# Patient Record
Sex: Female | Born: 1985 | Race: Black or African American | Hispanic: No | Marital: Single
Health system: Midwestern US, Community
[De-identification: ages and names within clinical notes are randomized; demographics above are authoritative.]

## PROBLEM LIST (undated history)

## (undated) DIAGNOSIS — R519 Headache, unspecified: Secondary | ICD-10-CM

## (undated) DIAGNOSIS — Z803 Family history of malignant neoplasm of breast: Secondary | ICD-10-CM

## (undated) DIAGNOSIS — F329 Major depressive disorder, single episode, unspecified: Secondary | ICD-10-CM

## (undated) DIAGNOSIS — F17200 Nicotine dependence, unspecified, uncomplicated: Secondary | ICD-10-CM

## (undated) DIAGNOSIS — E669 Obesity, unspecified: Secondary | ICD-10-CM

## (undated) DIAGNOSIS — Z9189 Other specified personal risk factors, not elsewhere classified: Secondary | ICD-10-CM

## (undated) DIAGNOSIS — R51 Headache: Secondary | ICD-10-CM

## (undated) DIAGNOSIS — Z1371 Encounter for nonprocreative screening for genetic disease carrier status: Secondary | ICD-10-CM

## (undated) DIAGNOSIS — F32A Depression, unspecified: Secondary | ICD-10-CM

## (undated) HISTORY — DX: Encounter for nonprocreative screening for genetic disease carrier status: Z13.71

## (undated) HISTORY — DX: Headache: R51

## (undated) HISTORY — DX: Major depressive disorder, single episode, unspecified: F32.9

## (undated) HISTORY — DX: Headache, unspecified: R51.9

## (undated) HISTORY — DX: Obesity, unspecified: E66.9

## (undated) HISTORY — DX: Nicotine dependence, unspecified, uncomplicated: F17.200

## (undated) HISTORY — DX: Family history of malignant neoplasm of breast: Z80.3

## (undated) HISTORY — DX: Other specified personal risk factors, not elsewhere classified: Z91.89

## (undated) HISTORY — PX: CHALAZION EXCISION: SHX213

## (undated) HISTORY — DX: Depression, unspecified: F32.A

---

## 2013-12-13 ENCOUNTER — Emergency Department (INDEPENDENT_AMBULATORY_CARE_PROVIDER_SITE_OTHER)
Admission: EM | Admit: 2013-12-13 | Discharge: 2013-12-13 | Disposition: A | Discharge status: Home / Self Care | Payer: Self-pay | Source: Home / Self Care | Attending: Family Medicine | Admitting: Family Medicine

## 2013-12-13 ENCOUNTER — Encounter (HOSPITAL_COMMUNITY): Payer: Self-pay | Admitting: Emergency Medicine

## 2013-12-13 DIAGNOSIS — S29019A Strain of muscle and tendon of unspecified wall of thorax, initial encounter: Secondary | ICD-10-CM

## 2013-12-13 DIAGNOSIS — S139XXA Sprain of joints and ligaments of unspecified parts of neck, initial encounter: Secondary | ICD-10-CM

## 2013-12-13 DIAGNOSIS — S239XXA Sprain of unspecified parts of thorax, initial encounter: Secondary | ICD-10-CM

## 2013-12-13 DIAGNOSIS — S161XXA Strain of muscle, fascia and tendon at neck level, initial encounter: Secondary | ICD-10-CM
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
## 2013-12-13 NOTE — Discharge Instructions (Signed)
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 your symptoms are getting worse. MAKE SURE YOU:   Understand these instructions.  Will watch your condition.  Will get help right away if you are not doing well or get worse. Document Released: 07/12/2005 Document Revised: 10/04/2011 Document Reviewed: 12/09/2010 [facility] Patient Information 2014 [place], [place].
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
## 2013-12-17 NOTE — ED Provider Notes (Signed)
Medical screening examination/treatment/procedure(s) were performed by a resident physician or non-physician practitioner and as the supervising physician I was immediately available for consultation/collaboration.  Evan Corey, MD    Evan S Corey, MD 12/17/13 0925 

## 2014-07-10 ENCOUNTER — Emergency Department (HOSPITAL_COMMUNITY)
Admission: EM | Admit: 2014-07-10 | Discharge: 2014-07-10 | Disposition: A | Discharge status: Home / Self Care | Payer: Self-pay | Attending: Emergency Medicine | Admitting: Emergency Medicine

## 2014-07-10 ENCOUNTER — Encounter (HOSPITAL_COMMUNITY): Payer: Self-pay | Admitting: Emergency Medicine

## 2014-07-10 DIAGNOSIS — H109 Unspecified conjunctivitis: Secondary | ICD-10-CM | POA: Insufficient documentation

## 2014-07-10 MED ORDER — CEPHALEXIN 500 MG PO CAPS: 500.0000 mg | ORAL_CAPSULE | Freq: Four times a day (QID) | ORAL | Status: DC

## 2014-07-10 MED ORDER — TOBRAMYCIN 0.3 % OP SOLN: 2.0000 [drp] | OPHTHALMIC | Status: DC

## 2014-07-10 MED ORDER — HYDROCODONE-ACETAMINOPHEN 5-325 MG PO TABS
2.0000 | ORAL_TABLET | Freq: Once | ORAL | Status: AC
  Administered 2014-07-10: 2 via ORAL
  Filled 2014-07-10: qty 2

## 2014-07-10 MED ORDER — HYDROCODONE-ACETAMINOPHEN 5-325 MG PO TABS: 2.0000 | ORAL_TABLET | ORAL | Status: DC | PRN

## 2014-07-10 NOTE — ED Notes (Signed)
Pt a/o x 4 on d/c with family. 

## 2014-07-10 NOTE — ED Notes (Signed)
Pt. reports left eye pain with redness/swelling onset yesterday , denies injury / no blurred vision .

## 2014-07-10 NOTE — ED Provider Notes (Signed)
CSN: [id number]     Arrival date & time 07/10/14  0011 History   First MD Initiated Contact with Patient 07/10/14 0025     Chief Complaint  Patient presents with  . Eye Pain     (Consider location/radiation/quality/duration/timing/severity/associated sxs/prior Treatment) Patient is a 28 y.o. female presenting with eye pain. The history is provided by the patient. No language interpreter was used.  Eye Pain This is a new problem. The current episode started today. The problem occurs constantly. The problem has been gradually worsening. Pertinent negatives include no congestion. Nothing aggravates the symptoms. She has tried nothing for the symptoms. The treatment provided moderate relief.    History reviewed. No pertinent past medical history. History reviewed. No pertinent past surgical history. No family history on file. History  Substance Use Topics  . Smoking status: Never Smoker   . Smokeless tobacco: Not on file  . Alcohol Use: Yes   OB History    No data available     Review of Systems  HENT: Negative for congestion.   Eyes: Positive for pain and redness.  All other systems reviewed and are negative.     Allergies  Review of patient's allergies indicates no known allergies.  Home Medications   Prior to Admission medications   Medication Sig Start Date End Date Taking? Authorizing Provider  cephALEXin (KEFLEX) 500 MG capsule Take 1 capsule (500 mg total) by mouth 4 (four) times daily. 07/10/14   Elson AreasLeslie K Jo Cerone, PA-C  HYDROcodone-acetaminophen (NORCO/VICODIN) 5-325 MG per tablet Take 2 tablets by mouth every 4 (four) hours as needed for moderate pain or severe pain. 07/10/14   Elson AreasLeslie K Ayomide Purdy, PA-C  tobramycin (TOBREX) 0.3 % ophthalmic solution Place 2 drops into the left eye every 4 (four) hours. 07/10/14   Elson AreasLeslie K Klohe Lovering, PA-C   BP 124/74 mmHg  Pulse 93  Temp(Src) 98.2 F (36.8 C) (Oral)  Resp 14  SpO2 100%  LMP 07/08/2014 Physical Exam  Constitutional:  She is oriented to person, place, and time. She appears well-developed and well-nourished.  HENT:  Head: Normocephalic.  Eyes: Conjunctivae are normal. Pupils are equal, round, and reactive to light.  Left eyelids swollen  Conjunctiva injected  No abscess  Neck: Normal range of motion.  Cardiovascular: Normal rate.   Pulmonary/Chest: Effort normal.  Neurological: She is alert and oriented to person, place, and time. She has normal reflexes.  Skin: Skin is warm.  Psychiatric: She has a normal mood and affect.  Nursing note and vitals reviewed.   ED Course  Procedures (including critical care time) Labs Review Labs Reviewed - No data to display  Imaging Review No results found.   EKG Interpretation None      MDM   Final diagnoses:  Conjunctivitis of left eye    tobrex Keflex hydrocodone    Elson AreasLeslie K Kloey Cazarez, PA-C 07/10/14 [id number]  Suzi RootsKevin E Steinl, MD 07/10/14 0120

## 2014-07-10 NOTE — Discharge Instructions (Signed)

## 2014-11-19 ENCOUNTER — Emergency Department (INDEPENDENT_AMBULATORY_CARE_PROVIDER_SITE_OTHER)
Admission: EM | Admit: 2014-11-19 | Discharge: 2014-11-19 | Disposition: A | Discharge status: Home / Self Care | Payer: Self-pay | Source: Home / Self Care

## 2014-11-19 ENCOUNTER — Encounter (HOSPITAL_COMMUNITY): Payer: Self-pay | Admitting: Emergency Medicine

## 2014-11-19 DIAGNOSIS — S0502XA Injury of conjunctiva and corneal abrasion without foreign body, left eye, initial encounter: Secondary | ICD-10-CM

## 2014-11-19 MED ORDER — OLOPATADINE HCL 0.2 % OP SOLN: 1.0000 [drp] | Freq: Every day | OPHTHALMIC | Status: DC

## 2014-11-19 MED ORDER — POLYMYXIN B-TRIMETHOPRIM 10000-0.1 UNIT/ML-% OP SOLN: 2.0000 [drp] | Freq: Four times a day (QID) | OPHTHALMIC | Status: DC

## 2014-11-19 MED ORDER — TETRACAINE HCL 0.5 % OP SOLN
OPHTHALMIC | Status: AC
  Filled 2014-11-19: qty 2

## 2014-11-19 NOTE — ED Notes (Signed)
Reports having problems with contacts.  Pt states that she is unsure if contact is displaced in eye.  Having a lot of pain and redness in the left eye, mild pain in right.  Incident happened today.

## 2014-11-19 NOTE — ED Provider Notes (Signed)
CSN: [id number]     Arrival date & time 11/19/14  1751 History   None    Chief Complaint  Patient presents with  . Eye Problem   (Consider location/radiation/quality/duration/timing/severity/associated sxs/prior Treatment) HPI  Eye pain bilat. Started this morning. Slept with contacts in last night which is typical for pt. Discharge present. Difficulty seeing  out of her right eye.. Contacts in place for 4 wks or more. Sent never removes her contacts with their place. Denies fevers, chest pain, shortness of breath, palpitations, headache, nausea, vomiting, neck pain, rash.  History reviewed. No pertinent past medical history. History reviewed. No pertinent past surgical history. Family History  Problem Relation Age of Onset  . Cancer Mother     breast   History  Substance Use Topics  . Smoking status: Never Smoker   . Smokeless tobacco: Not on file  . Alcohol Use: Yes   OB History    No data available     Review of Systems Per HPI with all other pertinent systems negative.   Allergies  Sulfa antibiotics  Home Medications   Prior to Admission medications   Medication Sig Start Date End Date Taking? Authorizing Provider  Olopatadine HCl 0.2 % SOLN Apply 1 drop to eye daily. 11/19/14   Ozella Rocksavid J Merrell, MD  trimethoprim-polymyxin b (POLYTRIM) ophthalmic solution Place 2 drops into the left eye every 6 (six) hours. Treat for 5-7 days 11/19/14   Ozella Rocksavid J Merrell, MD   BP 119/90 mmHg  Pulse 90  Temp(Src) 98.1 F (36.7 C) (Oral)  Resp 12  SpO2 100%  LMP 11/01/2014 Physical Exam Physical Exam  Constitutional: oriented to person, place, and time. appears well-developed and well-nourished. No distress.  HENT:  Head: Normocephalic and atraumatic.  Eyes: EOMI, PERRLA, left cornea under foreseen exam with small pinpoint defect in the center of the eye which would've been under the center of the contact lens. Of note contact lens for the left eye was removed and noted to have  ragged edges throughout. Right contact lens was not in place and patient was unable to locate it. No corneal abrasion of the right.  Neck: Normal range of motion.  Cardiovascular: RRR, no m/r/g, 2+ distal pulses,  Pulmonary/Chest: Effort normal and breath sounds normal. No respiratory distress.  Abdominal: Soft. Bowel sounds are normal. NonTTP, no distension.  Musculoskeletal: Normal range of motion. Non ttp, no effusion.  Neurological: alert and oriented to person, place, and time.  Skin: Skin is warm. No rash noted. non diaphoretic.  Psychiatric: normal mood and affect. behavior is normal. Judgment and thought content normal.    ED Course  Procedures (including critical care time) Labs Review Labs Reviewed - No data to display  Imaging Review No results found.   MDM   1. Corneal abrasion, left, initial encounter    Patient counseled on proper contact lens use. Patient needs to forego wearing contact lenses until current infection improves. Polytrim drops and Pataday drops for relief.    Ozella Rocksavid J Merrell, MD 11/19/14 [phone]

## 2014-11-19 NOTE — Discharge Instructions (Signed)
You have caused a corneal abrasion of her left eye. This is been complicated by a bacterial infection of the eye. Please start the antibiotic drops. Please also use the Pataday drops for red eye relief. If the Pataday is expensive you can buy over-the-counter Zaditor. This was caused in part due to wearing contact lenses for such a prolonged period of time. Please remove these every night if they are reusable put them back in in the morning. Please change them as prescribed or much more frequently than currently doing.

## 2015-09-25 ENCOUNTER — Encounter: Payer: Self-pay | Admitting: Family Medicine

## 2015-09-25 ENCOUNTER — Ambulatory Visit (INDEPENDENT_AMBULATORY_CARE_PROVIDER_SITE_OTHER): Payer: Managed Care, Other (non HMO) | Admitting: Family Medicine

## 2015-09-25 VITALS — BP 128/78 | HR 88 | Temp 98.0°F | Ht 67.0 in | Wt 198.2 lb

## 2015-09-25 DIAGNOSIS — Z23 Encounter for immunization: Secondary | ICD-10-CM

## 2015-09-25 DIAGNOSIS — Z87891 Personal history of nicotine dependence: Secondary | ICD-10-CM | POA: Insufficient documentation

## 2015-09-25 DIAGNOSIS — L84 Corns and callosities: Secondary | ICD-10-CM

## 2015-09-25 DIAGNOSIS — F17200 Nicotine dependence, unspecified, uncomplicated: Secondary | ICD-10-CM | POA: Insufficient documentation

## 2015-09-25 DIAGNOSIS — Z01419 Encounter for gynecological examination (general) (routine) without abnormal findings: Secondary | ICD-10-CM

## 2015-09-25 DIAGNOSIS — Z720 Tobacco use: Secondary | ICD-10-CM

## 2015-09-25 DIAGNOSIS — Z0000 Encounter for general adult medical examination without abnormal findings: Secondary | ICD-10-CM

## 2015-09-25 DIAGNOSIS — E669 Obesity, unspecified: Secondary | ICD-10-CM | POA: Diagnosis not present

## 2015-09-25 DIAGNOSIS — E66811 Obesity, class 1: Secondary | ICD-10-CM

## 2015-09-25 DIAGNOSIS — E041 Nontoxic single thyroid nodule: Secondary | ICD-10-CM | POA: Diagnosis not present

## 2015-09-25 HISTORY — DX: Personal history of nicotine dependence: Z87.891

## 2015-09-25 HISTORY — DX: Obesity, class 1: E66.811

## 2015-09-25 HISTORY — DX: Nicotine dependence, unspecified, uncomplicated: F17.200

## 2015-09-25 HISTORY — DX: Corns and callosities: L84

## 2015-09-25 HISTORY — DX: Obesity, unspecified: E66.9

## 2015-09-25 NOTE — Assessment & Plan Note (Signed)
Discussed healthy diet and lifestyle changes to affect sustainable weight loss  

## 2015-09-25 NOTE — Assessment & Plan Note (Signed)
Longstanding, worse over last year. Will refer to podiatry for further eval/treatment.

## 2015-09-25 NOTE — Progress Notes (Signed)
BP 128/78 mmHg  Pulse 88  Temp(Src) 98 F (36.7 C) (Oral)  Ht  (1.702 m)  Wt 198 lb 4 oz (89.926 kg)  BMI 31.04 kg/m2  LMP 09/01/2015   CC: new pt to establish care  Subjective:    Patient ID: Anna Bass, female    DOB: [birth year], 30 y.o.   MRN: [id number]  HPI: Anna Bass is a 30 y.o. female presenting on 09/25/2015 for Establish Care   [employer] worker.   Irregular bowel movements. Normally formed stool every 2-4 days. H/o fecal impaction 2010 s/p manual disimpaction in [place]. Irregular fiber in diet. Drinking water regularly.   Foot pain - significant calluses bilaterally with pain, worse over the past year. L2 R1  H/o depression but doing well.  Preventative: Last CPE 2012 with well woman exam - always normal pap smears. Would like referral to OBGYN to establish care.  LMP 08/2015 Flu shot today  Tetanus -?2014  Seat belt use discussed No changing moles on skin  Lives with cousin, son and mother Edu: college - nursing --> communications degree Occupation: Publishing copy: [employer] Activity: no regular exercise Diet: fruits/vegetables daily, some water  Relevant past medical, surgical, family and social history reviewed and updated as indicated. Interim medical history since our last visit reviewed. Allergies and medications reviewed and updated. No current outpatient prescriptions on file prior to visit.   No current facility-administered medications on file prior to visit.    Review of Systems Per HPI unless specifically indicated in ROS section     Objective:    BP 128/78 mmHg  Pulse 88  Temp(Src) 98 F (36.7 C) (Oral)  Ht  (1.702 m)  Wt 198 lb 4 oz (89.926 kg)  BMI 31.04 kg/m2  LMP 09/01/2015  Wt Readings from Last 3 Encounters:  09/25/15 198 lb 4 oz (89.926 kg)    Physical Exam  Constitutional: She is oriented to person, place, and time. She appears well-developed and well-nourished. No distress.  HENT:  Head:  Normocephalic and atraumatic.  Right Ear: Hearing, tympanic membrane, external ear and ear canal normal.  Left Ear: Hearing, tympanic membrane, external ear and ear canal normal.  Nose: Nose normal.  Mouth/Throat: Uvula is midline, oropharynx is clear and moist and mucous membranes are normal. No oropharyngeal exudate, posterior oropharyngeal edema or posterior oropharyngeal erythema.  Eyes: Conjunctivae and EOM are normal. Pupils are equal, round, and reactive to light. No scleral icterus.  Neck: Normal range of motion. Neck supple. Thyromegaly (R thyroid nodule) present.  Cardiovascular: Normal rate, regular rhythm, normal heart sounds and intact distal pulses.   No murmur heard. Pulses:      Radial pulses are 2+ on the right side, and 2+ on the left side.  Pulmonary/Chest: Effort normal and breath sounds normal. No respiratory distress. She has no wheezes. She has no rales.  Abdominal: Soft. Bowel sounds are normal. She exhibits no distension and no mass. There is no tenderness. There is no rebound and no guarding.  Musculoskeletal: Normal range of motion. She exhibits no edema.  Thickened calluses L x2, R x1  Lymphadenopathy:    She has no cervical adenopathy.  Neurological: She is alert and oriented to person, place, and time.  CN grossly intact, station and gait intact  Skin: Skin is warm and dry. No rash noted.  Psychiatric: She has a normal mood and affect. Her behavior is normal. Judgment and thought content normal.  Nursing note and vitals reviewed.  No results found for this or any previous visit.    Assessment & Plan:   Problem List Items Addressed This Visit    Smoker    Continue to encourage cessation.       Right thyroid nodule - Primary    Check thyroid US. No fmhx thyroid disease. Return for TFTs      Relevant Orders   US Soft Tissue Head/Neck   TSH   T4, free   Obesity, Class I, BMI 30-34.9    Discussed healthy diet and lifestyle changes to affect  sustainable weight loss      Relevant Orders   Lipid panel   Basic metabolic panel   Hepatic function panel   Corns and callus    Longstanding, worse over last year. Will refer to podiatry for further eval/treatment.       Relevant Orders   Ambulatory referral to Podiatry    Other Visit Diagnoses    Well woman exam        Relevant Orders    Ambulatory referral to Gynecology        Follow up plan: Return in about 1 year (around 09/24/2016) for annual exam, prior fasting for blood work.

## 2015-09-25 NOTE — Patient Instructions (Addendum)
Flu shot today. Increase water in your diet and start metamucil or benefiber (soluble fiber supplement) daily.  Double check on last tetanus shot so we can update your chart (?student health at school). We will refer you to OBGYN for well woman exam. Pass by Marion's office to schedule thyroid ultrasound Return at your convenience for fasting labs (no eating for 4-5 hours prior).  We will refer you to podiatrist for evaluation of feet. Return as needed or in 1 year for physical.

## 2015-09-25 NOTE — Addendum Note (Signed)
Addended by: Josph Macho A on: 09/25/2015 04:21 PM   Modules accepted: Orders

## 2015-09-25 NOTE — Assessment & Plan Note (Signed)
Continue to encourage cessation. 

## 2015-09-25 NOTE — Assessment & Plan Note (Signed)
Check thyroid US. No fmhx thyroid disease. Return for TFTs

## 2015-09-25 NOTE — Progress Notes (Signed)
Pre visit review using our clinic review tool, if applicable. No additional management support is needed unless otherwise documented below in the visit note. 

## 2015-10-02 ENCOUNTER — Ambulatory Visit
Admission: RE | Admit: 2015-10-02 | Discharge: 2015-10-02 | Disposition: A | Discharge status: Home / Self Care | Payer: Managed Care, Other (non HMO) | Source: Ambulatory Visit | Attending: Family Medicine | Admitting: Family Medicine

## 2015-10-02 DIAGNOSIS — E041 Nontoxic single thyroid nodule: Secondary | ICD-10-CM

## 2015-10-03 ENCOUNTER — Encounter: Payer: Self-pay | Admitting: *Deleted

## 2015-10-09 ENCOUNTER — Ambulatory Visit (INDEPENDENT_AMBULATORY_CARE_PROVIDER_SITE_OTHER): Payer: Managed Care, Other (non HMO) | Admitting: Podiatry

## 2015-10-09 ENCOUNTER — Encounter: Payer: Self-pay | Admitting: Podiatry

## 2015-10-09 ENCOUNTER — Ambulatory Visit (INDEPENDENT_AMBULATORY_CARE_PROVIDER_SITE_OTHER): Payer: Managed Care, Other (non HMO)

## 2015-10-09 VITALS — BP 100/72 | HR 83 | Resp 16 | Ht 67.0 in | Wt 198.0 lb

## 2015-10-09 DIAGNOSIS — M79671 Pain in right foot: Secondary | ICD-10-CM

## 2015-10-09 DIAGNOSIS — M79672 Pain in left foot: Secondary | ICD-10-CM | POA: Diagnosis not present

## 2015-10-09 DIAGNOSIS — L84 Corns and callosities: Secondary | ICD-10-CM | POA: Diagnosis not present

## 2015-10-09 DIAGNOSIS — M216X9 Other acquired deformities of unspecified foot: Secondary | ICD-10-CM | POA: Diagnosis not present

## 2015-10-09 NOTE — Progress Notes (Signed)
   Subjective:    Patient ID: Anna Bass, female    DOB: 08/28/1985, 30 y.o.   MRN: [id number]  HPI Patient presents with bilateral callouses; plantar forefoot. Pt stated, "pain is on and off when walking"; x2-3 yrs.   Review of Systems  All other systems reviewed and are negative.      Objective:   Physical Exam        Assessment & Plan:

## 2015-10-15 NOTE — Progress Notes (Signed)
Subjective:     Patient ID: Anna Bass, female   DOB: 12/18/1985, 30 y.o.   MRN: [id number]  HPI patient presents stating I have these very painful corns and calluses on both my feet that make it hard to walk. Patient does have moderate obesity which is complicating factor   Review of Systems  All other systems reviewed and are negative.      Objective:   Physical Exam  Constitutional: She is oriented to person, place, and time.  Cardiovascular: Intact distal pulses.   Musculoskeletal: Normal range of motion.  Neurological: She is oriented to person, place, and time.  Skin: Skin is warm.  Nursing note and vitals reviewed.  neurovascular status intact muscle strength adequate range of motion within normal limits with patient found to have severe plantar calluses on metatarsal surfaces that are painful when pressed and make walking difficult. Patient has tried trimming them herself and has tried padding without relief. Found to have good digital perfusion and well oriented 3     Assessment:     Plantar keratotic lesions with pain bilateral    Plan:     H&P condition reviewed and discussed treatment options today. At this time her to treat conservatively with deep debridement with the consideration long-term for either osteotomy or metatarsal head resection depending on response. I will reevaluate in 4 weeks and decide how quickly they are thickening and what would be best and reviewed x-rays with her today  X-ray report indicates they are on the metatarsal heads and do affect the fifth and first metatarsal with sesamoid involvement

## 2015-10-21 ENCOUNTER — Encounter: Payer: Managed Care, Other (non HMO) | Admitting: Obstetrics & Gynecology

## 2015-11-06 ENCOUNTER — Ambulatory Visit: Payer: Managed Care, Other (non HMO) | Admitting: Podiatry

## 2015-11-20 ENCOUNTER — Telehealth: Payer: Self-pay

## 2015-11-20 NOTE — Telephone Encounter (Signed)
Called patient about rescheduling a visit from a referral that was placed at our office, no answer, left message, instructing her to return my call here at the office.

## 2015-12-01 ENCOUNTER — Telehealth: Payer: Self-pay

## 2015-12-01 NOTE — Telephone Encounter (Signed)
Called patient about referral that was sent to our office, attempting to reschedule appt, no answer, left voicemail instructing patient to return my call here at the office

## 2017-03-04 IMAGING — US US SOFT TISSUE HEAD/NECK
1 series · 14 of 25 positions shown · non-contrast
Comparison: None.

CLINICAL DATA: 29-year-old female with a right-sided thyroid nodule
on physical exam

EXAM:
THYROID ULTRASOUND
TECHNIQUE: Ultrasound examination of the thyroid gland and adjacent soft
tissues was performed.

[Series 1: us soft tissue head/neck · 0.08mm/px · 14 of 37 slices shown]
[im 1/37]
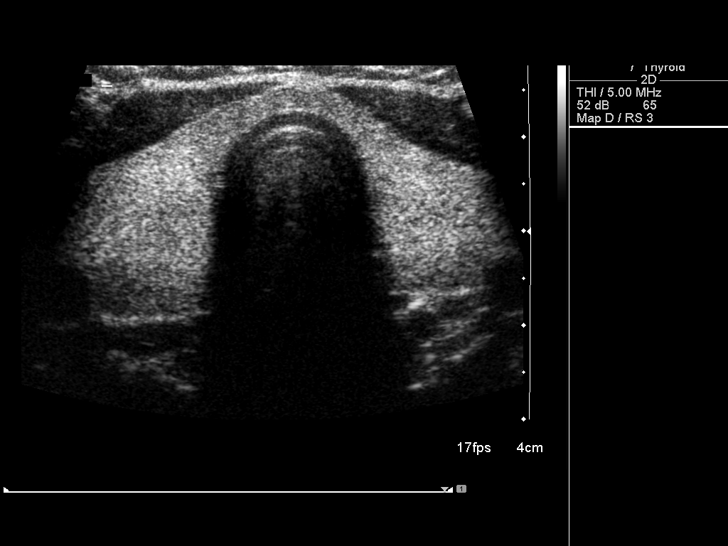
[im 4/37]
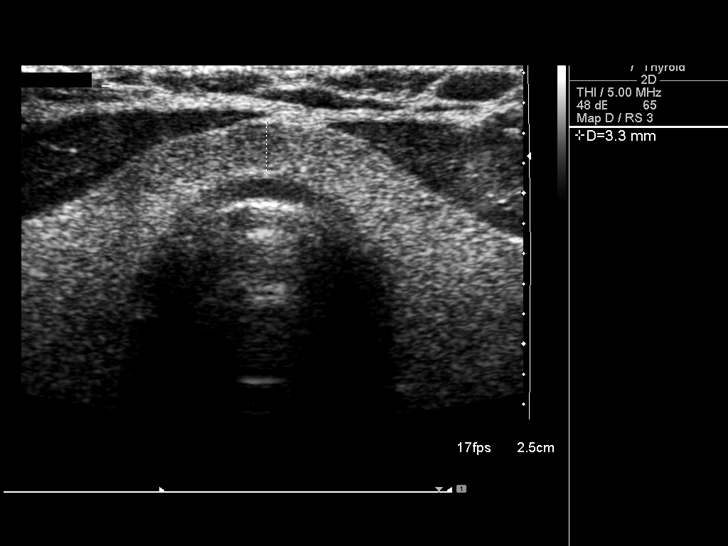
[im 7/37]
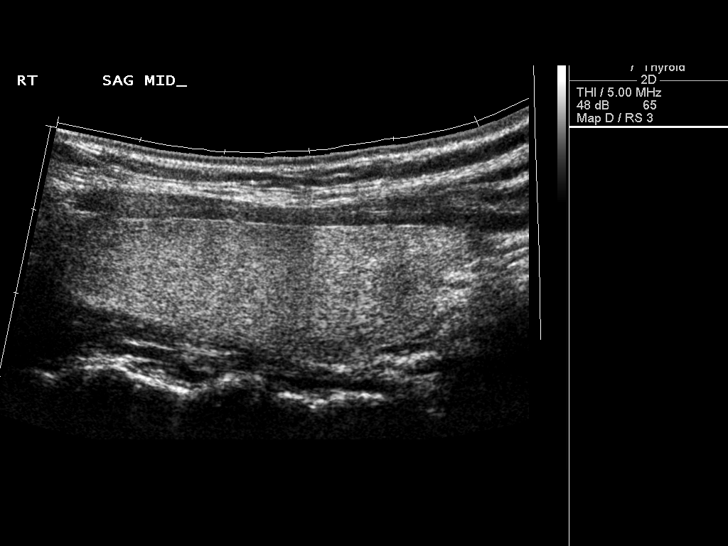
[im 10/37]
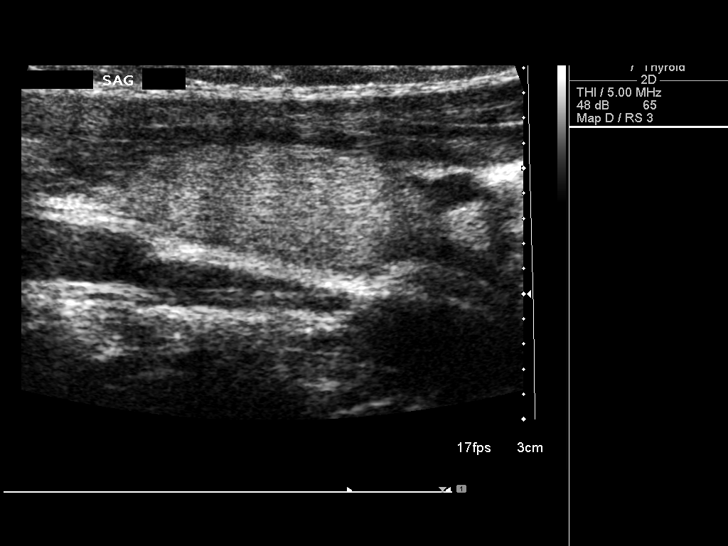
[im 13/37]
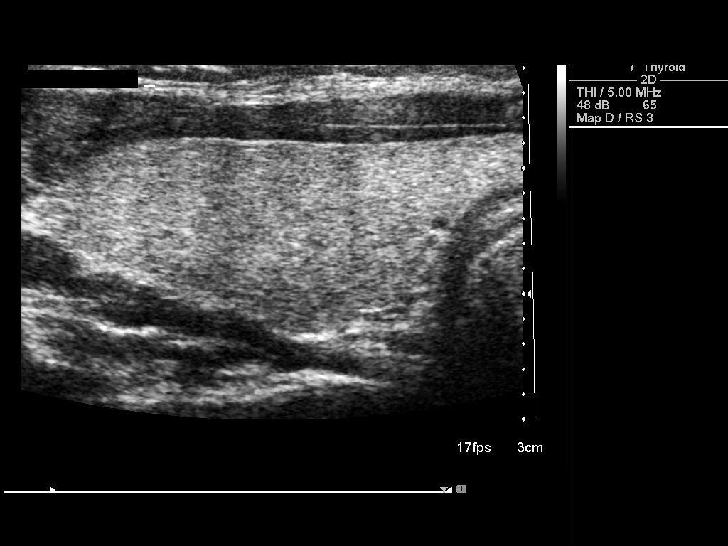
[im 14/37]
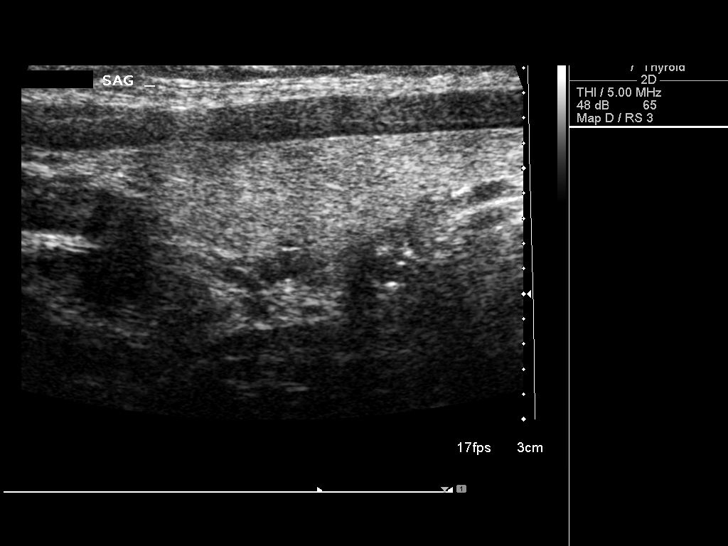
[im 17/37]
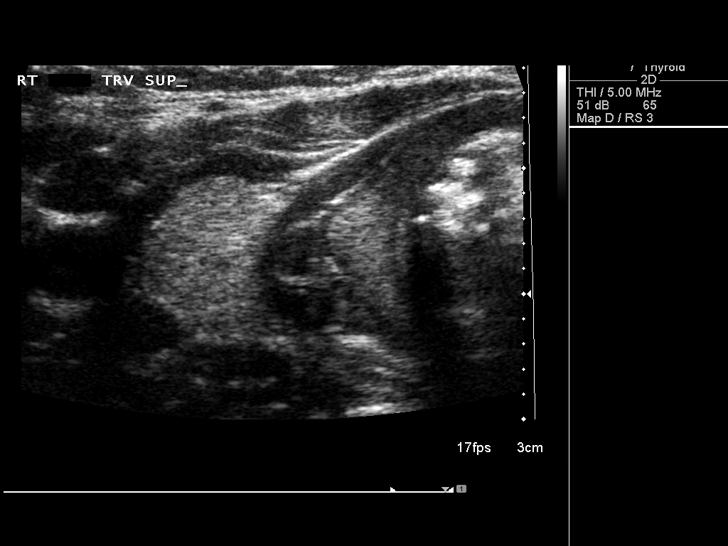
[im 20/37]
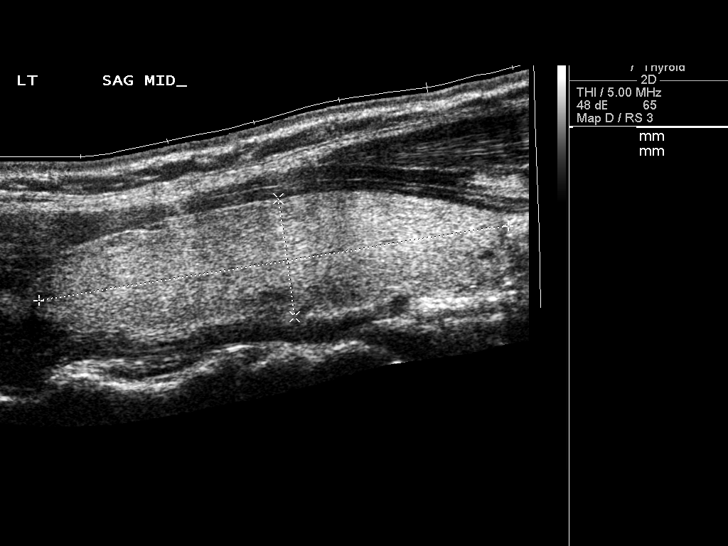
[im 23/37]
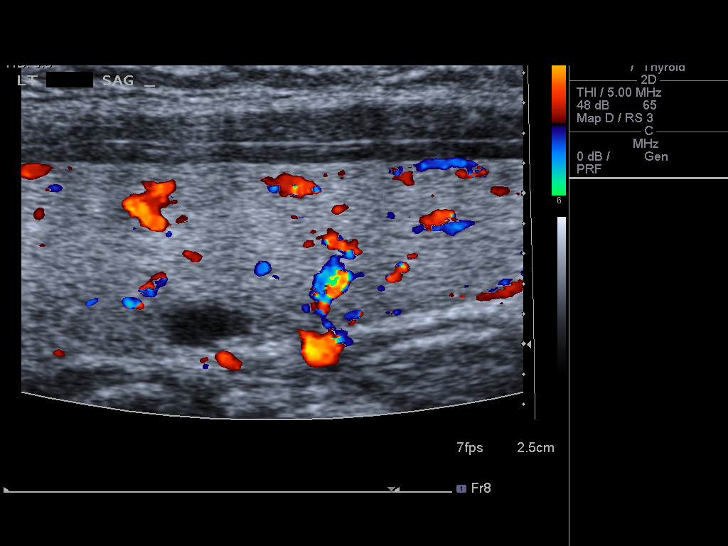
[im 25/37]
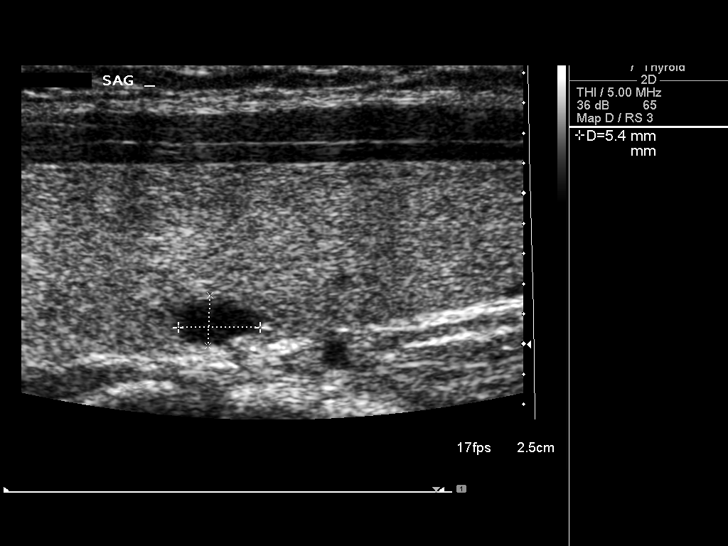
[im 28/37]
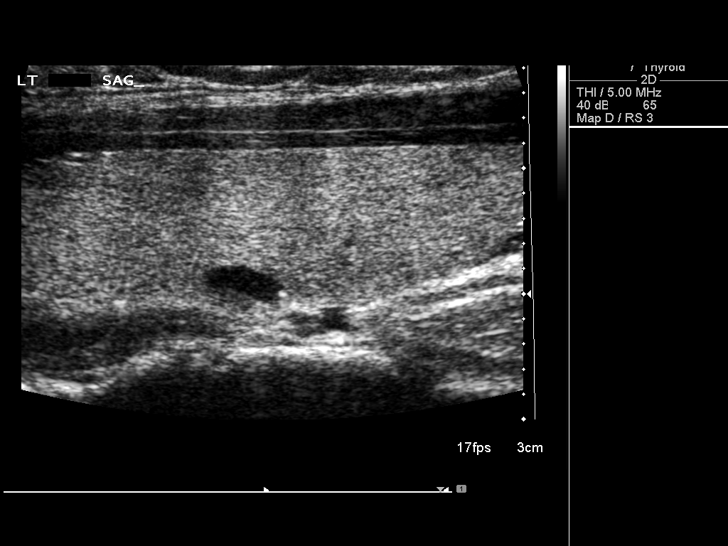
[im 31/37]
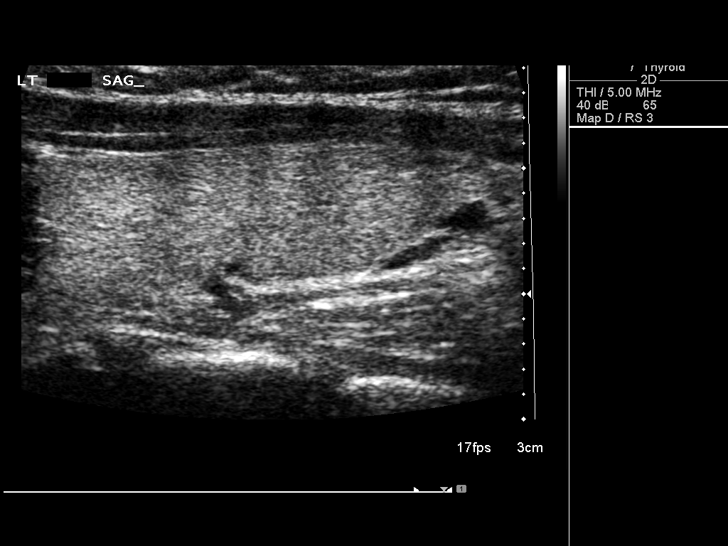
[im 34/37]
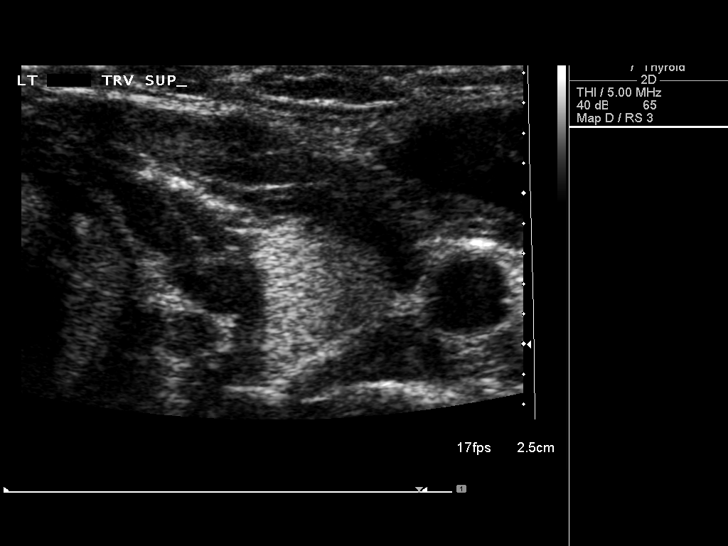
[im 37/37]
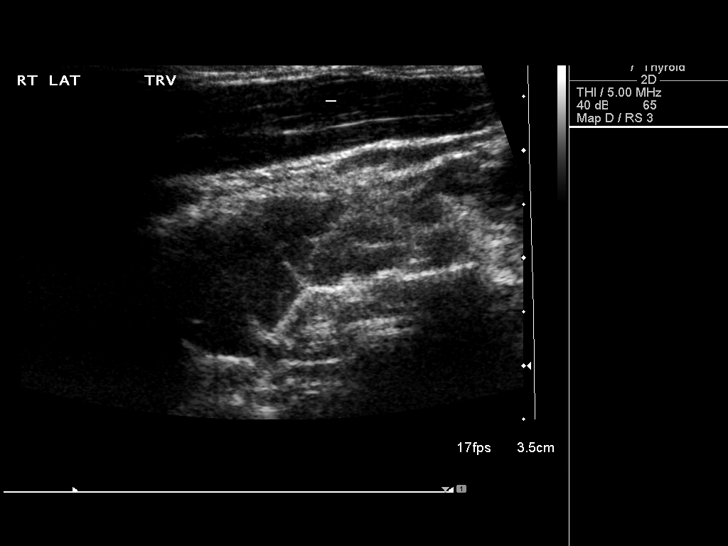

[14 of 25 positions shown; findings below may reference images not displayed]

FINDINGS: Right thyroid lobe

Measurements: 5.1 x 1.6 x 2.3 cm.  No nodules visualized.

Left thyroid lobe

Measurements: 5.4 x 1.4 x 1.8 cm. Tiny hypoechoic cyst versus nodule
in the deep aspect of the mid to lower gland measuring 5 x 3 x 5 mm.

Isthmus

Thickness: 0.3 cm.  No nodules visualized.

Lymphadenopathy

None visualized.
IMPRESSION: 1. No evidence of right-sided thyroid nodule.
2. Incidental note is made of a tiny 5 mm cyst versus nodule in the
mid to lower left gland.

## 2017-06-24 ENCOUNTER — Encounter (HOSPITAL_COMMUNITY): Payer: Self-pay | Admitting: *Deleted

## 2017-06-24 DIAGNOSIS — M545 Low back pain: Secondary | ICD-10-CM | POA: Insufficient documentation

## 2017-06-24 LAB — COMPREHENSIVE METABOLIC PANEL
ALBUMIN: 4.1 g/dL (ref 3.5–5.0)
ALT: 14 U/L (ref 14–54)
AST: 19 U/L (ref 15–41)
Alkaline Phosphatase: 57 U/L (ref 38–126)
Anion gap: 6 (ref 5–15)
BILIRUBIN TOTAL: 0.3 mg/dL (ref 0.3–1.2)
BUN: 10 mg/dL (ref 6–20)
CO2: 26 mmol/L (ref 22–32)
Calcium: 9.3 mg/dL (ref 8.9–10.3)
Chloride: 104 mmol/L (ref 101–111)
Creatinine, Ser: 1.07 mg/dL — ABNORMAL HIGH (ref 0.44–1.00)
GFR calc Af Amer: >60 mL/min (ref >60)
GFR calc non Af Amer: >60 mL/min (ref >60)
GLUCOSE: 93 mg/dL (ref 65–99)
Potassium: 3.8 mmol/L (ref 3.5–5.1)
Sodium: 136 mmol/L (ref 135–145)
Total Protein: 6.6 g/dL (ref 6.5–8.1)

## 2017-06-24 LAB — I-STAT BETA HCG BLOOD, ED (MC, WL, AP ONLY): I-stat hCG, quantitative: <5 m[IU]/mL (ref <5)

## 2017-06-24 LAB — CBC
HCT: 39.4 % (ref 36.0–46.0)
Hemoglobin: 13.2 g/dL (ref 12.0–15.0)
MCH: 32.9 pg (ref 26.0–34.0)
MCHC: 33.5 g/dL (ref 30.0–36.0)
MCV: 98.3 fL (ref 78.0–100.0)
PLATELETS: 281 10*3/uL (ref 150–400)
RBC: 4.01 MIL/uL (ref 3.87–5.11)
RDW: 12.5 % (ref 11.5–15.5)
WBC: 6.7 10*3/uL (ref 4.0–10.5)
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
## 2017-06-25 ENCOUNTER — Emergency Department (HOSPITAL_COMMUNITY)
Admission: EM | Admit: 2017-06-25 | Discharge: 2017-06-25 | Disposition: A | Discharge status: Home / Self Care | Payer: Managed Care, Other (non HMO) | Attending: Physician Assistant | Admitting: Physician Assistant

## 2017-06-25 MED ORDER — IBUPROFEN 800 MG PO TABS: 800.0000 mg | ORAL_TABLET | Freq: Three times a day (TID) | ORAL | 0 refills | Status: DC

## 2017-06-25 MED ORDER — CYCLOBENZAPRINE HCL 10 MG PO TABS: 10.0000 mg | ORAL_TABLET | Freq: Two times a day (BID) | ORAL | 0 refills | Status: DC | PRN
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
## 2018-04-20 ENCOUNTER — Emergency Department: Payer: Managed Care, Other (non HMO)

## 2018-04-20 ENCOUNTER — Other Ambulatory Visit: Payer: Self-pay

## 2018-04-20 ENCOUNTER — Emergency Department
Admission: EM | Admit: 2018-04-20 | Discharge: 2018-04-20 | Disposition: A | Discharge status: Home / Self Care | Payer: Managed Care, Other (non HMO) | Attending: Emergency Medicine | Admitting: Emergency Medicine

## 2018-04-20 DIAGNOSIS — S4992XA Unspecified injury of left shoulder and upper arm, initial encounter: Secondary | ICD-10-CM | POA: Diagnosis present

## 2018-04-20 DIAGNOSIS — G8911 Acute pain due to trauma: Secondary | ICD-10-CM | POA: Insufficient documentation

## 2018-04-20 DIAGNOSIS — Y999 Unspecified external cause status: Secondary | ICD-10-CM | POA: Insufficient documentation

## 2018-04-20 DIAGNOSIS — Y9289 Other specified places as the place of occurrence of the external cause: Secondary | ICD-10-CM | POA: Diagnosis not present

## 2018-04-20 DIAGNOSIS — M25512 Pain in left shoulder: Secondary | ICD-10-CM | POA: Diagnosis not present

## 2018-04-20 DIAGNOSIS — T148XXA Other injury of unspecified body region, initial encounter: Secondary | ICD-10-CM

## 2018-04-20 DIAGNOSIS — M70812 Other soft tissue disorders related to use, overuse and pressure, left shoulder: Secondary | ICD-10-CM | POA: Insufficient documentation

## 2018-04-20 DIAGNOSIS — F17210 Nicotine dependence, cigarettes, uncomplicated: Secondary | ICD-10-CM | POA: Insufficient documentation

## 2018-04-20 DIAGNOSIS — Y9389 Activity, other specified: Secondary | ICD-10-CM | POA: Diagnosis not present
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
## 2018-04-20 NOTE — Discharge Instructions (Signed)
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
## 2018-07-31 ENCOUNTER — Telehealth: Payer: Self-pay

## 2018-07-31 ENCOUNTER — Encounter: Payer: Self-pay | Admitting: Family Medicine

## 2018-07-31 ENCOUNTER — Ambulatory Visit: Payer: 59 | Admitting: Family Medicine

## 2018-07-31 VITALS — BP 118/72 | HR 86 | Temp 98.2°F | Ht 67.0 in | Wt 140.2 lb

## 2018-07-31 DIAGNOSIS — R634 Abnormal weight loss: Secondary | ICD-10-CM

## 2018-07-31 DIAGNOSIS — F419 Anxiety disorder, unspecified: Secondary | ICD-10-CM

## 2018-07-31 DIAGNOSIS — I441 Atrioventricular block, second degree: Secondary | ICD-10-CM | POA: Insufficient documentation

## 2018-07-31 DIAGNOSIS — R55 Syncope and collapse: Secondary | ICD-10-CM

## 2018-07-31 DIAGNOSIS — F17200 Nicotine dependence, unspecified, uncomplicated: Secondary | ICD-10-CM

## 2018-07-31 DIAGNOSIS — Z23 Encounter for immunization: Secondary | ICD-10-CM | POA: Diagnosis not present

## 2018-07-31 DIAGNOSIS — Z01419 Encounter for gynecological examination (general) (routine) without abnormal findings: Secondary | ICD-10-CM

## 2018-07-31 HISTORY — DX: Syncope and collapse: R55

## 2018-07-31 HISTORY — DX: Abnormal weight loss: R63.4

## 2018-07-31 LAB — CBC WITH DIFFERENTIAL/PLATELET
Basophils Absolute: 0.1 10*3/uL (ref 0.0–0.1)
Basophils Relative: 0.9 % (ref 0.0–3.0)
Eosinophils Absolute: 0.1 10*3/uL (ref 0.0–0.7)
Eosinophils Relative: 1.7 % (ref 0.0–5.0)
HCT: 41.5 % (ref 36.0–46.0)
Hemoglobin: 13.9 g/dL (ref 12.0–15.0)
LYMPHS PCT: 48.8 % — AB (ref 12.0–46.0)
Lymphs Abs: 3.2 10*3/uL (ref 0.7–4.0)
MCHC: 33.5 g/dL (ref 30.0–36.0)
MCV: 100.3 fl — AB (ref 78.0–100.0)
MONOS PCT: 8.1 % (ref 3.0–12.0)
Monocytes Absolute: 0.5 10*3/uL (ref 0.1–1.0)
NEUTROS PCT: 40.5 % — AB (ref 43.0–77.0)
Neutro Abs: 2.7 10*3/uL (ref 1.4–7.7)
Platelets: 289 10*3/uL (ref 150.0–400.0)
RBC: 4.13 Mil/uL (ref 3.87–5.11)
RDW: 12.8 % (ref 11.5–15.5)
WBC: 6.6 10*3/uL (ref 4.0–10.5)

## 2018-07-31 LAB — TSH: TSH: 0.77 u[IU]/mL (ref 0.35–4.50)

## 2018-07-31 LAB — COMPREHENSIVE METABOLIC PANEL
ALBUMIN: 4.4 g/dL (ref 3.5–5.2)
ALK PHOS: 54 U/L (ref 39–117)
ALT: 16 U/L (ref 0–35)
AST: 16 U/L (ref 0–37)
BUN: 11 mg/dL (ref 6–23)
CO2: 28 mEq/L (ref 19–32)
Calcium: 9.4 mg/dL (ref 8.4–10.5)
Chloride: 107 mEq/L (ref 96–112)
Creatinine, Ser: 0.91 mg/dL (ref 0.40–1.20)
GFR: 91.23 mL/min (ref >60.00)
Glucose, Bld: 83 mg/dL (ref 70–99)
Potassium: 3.9 mEq/L (ref 3.5–5.1)
Sodium: 139 mEq/L (ref 135–145)
TOTAL PROTEIN: 6.8 g/dL (ref 6.0–8.3)
Total Bilirubin: 0.4 mg/dL (ref 0.2–1.2)

## 2018-07-31 NOTE — Telephone Encounter (Signed)
Pt said on 07/30/18 at 8:30 PM pt was at [employer] and felt dizzy and had hot flash and then fainted. LMP 07/25/18. Now pt has H/A pain level 4. When pt fainted she fell and hit head on floor. Pt did not go to ED to be eval. Now no dizziness or vision changes. Pt request appt/ pt scheduled appt with Dr Reece Agar 07/31/18 at 12 noon. It pt condition changes or worsens prior to appt pt will go to ED.

## 2018-07-31 NOTE — Patient Instructions (Addendum)
EKG today Labs today See our referral coordinator for GYN referral.  Work on nutrition - 3 meals a day, consider getting boost or ensure supplements. Consider getting multivitamin.  Return in 4-6 weeks for follow up visit.

## 2018-07-31 NOTE — Telephone Encounter (Signed)
Noted  

## 2018-07-31 NOTE — Progress Notes (Signed)
BP 118/72 (BP Location: Left Arm, Patient Position: Sitting, Cuff Size: Normal)   Pulse 86   Temp 98.2 F (36.8 C) (Oral)   Ht 5\' 7"  (1.702 m)   Wt 140 lb 4 oz (63.6 kg)   LMP 07/25/2018   BMI 21.97 kg/m   Orthostatic VS for the past 24 hrs (Last 3 readings):  BP- Lying BP- Standing at 0 minutes  07/31/18 1220 - 116/80  07/31/18 1218 122/70 -    CC: syncope Subjective:    Patient ID: Anna Bass, female    DOB: Jan 10, 1986, 33 y.o.   MRN: [id number]  HPI: Anna Bass is a 33 y.o. female presenting on 07/31/2018 for Dizziness (Pt states she had some dizziness and fainted on 07/30/17 while at [employer]. Pt hit the back of head. Denies any dizziness since but c/o of minor HA. )    Fainted last night while at [employer]. Felt like she was blacking out - she was also diaphoretic after passing out. Didn't bite tongue, bowel/bladder incontinence. No witnessed seizure activity. No h/o seizures. Ate good breakfast and dinner that day prior to passing out.   Denies palpitations, chest pain, dyspnea, headaches.   This morning woke up with headache, worried she hit her head during fall.   Almost 60 lb weight loss in last 2 years.  Significant stressors over last 3 yrs. Work stress, family stress (single mother).   Denies fevers/chills, night sweats, dyspnea, cough, abd pain, nausea/vomiting, diarrhea/constipation, blood in stool or urine. Denies new lumps on breasts.  + off and on fatigue.   fmhx cancer - mother with leukemia, grandmother with breast and brain cancer, mother with breast cancer.   Last well woman 2012 - referred to [facility] 2017 but did not go.  LMP 07/25/2018, regular, not heavy bleeding. No abnormal spotting or bleeding.   Current smoker - working on quitting with the [employer] - down to 5 cig/day.  Alcohol - 1-2 beers/week.  Rec drugs - denies currently  Anxiety - sees psychiatrist and counselor - prescribed wellbutrin 150mg  bid and klonopin bid (unsure  dose).      Relevant past medical, surgical, family and social history reviewed and updated as indicated. Interim medical history since our last visit reviewed. Allergies and medications reviewed and updated. Outpatient Medications Prior to Visit  Medication Sig Dispense Refill  . buPROPion (WELLBUTRIN SR) 150 MG 12 hr tablet Take 150 mg by mouth 2 (two) times daily.  5  . cyclobenzaprine (FLEXERIL) 10 MG tablet Take 1 tablet (10 mg total) by mouth 2 (two) times daily as needed for muscle spasms. 10 tablet 0  . ibuprofen (ADVIL,MOTRIN) 800 MG tablet Take 1 tablet (800 mg total) by mouth 3 (three) times daily. 21 tablet 0  . temazepam (RESTORIL) 30 MG capsule Take 30 mg by mouth at bedtime.  5  . clonazePAM (KLONOPIN) 1 MG tablet Take 1 tablet (1 mg total) by mouth 2 (two) times daily.     No facility-administered medications prior to visit.      Per HPI unless specifically indicated in ROS section below Review of Systems Objective:    BP 118/72 (BP Location: Left Arm, Patient Position: Sitting, Cuff Size: Normal)   Pulse 86   Temp 98.2 F (36.8 C) (Oral)   Ht 5\' 7"  (1.702 m)   Wt 140 lb 4 oz (63.6 kg)   LMP 07/25/2018   BMI 21.97 kg/m   Wt Readings from Last 3 Encounters:  07/31/18 140  lb 4 oz (63.6 kg)  04/20/18 150 lb (68 kg)  10/09/15 198 lb (89.8 kg)    Physical Exam Vitals signs and nursing note reviewed.  Constitutional:      General: She is not in acute distress.    Appearance: Normal appearance.  HENT:     Mouth/Throat:     Mouth: Mucous membranes are moist.     Pharynx: Oropharynx is clear.  Neck:     Thyroid: No thyroid mass or thyromegaly.  Cardiovascular:     Rate and Rhythm: Normal rate and regular rhythm.     Pulses: Normal pulses.     Heart sounds: Normal heart sounds. No murmur.  Pulmonary:     Effort: Pulmonary effort is normal. No respiratory distress.     Breath sounds: Normal breath sounds. No wheezing, rhonchi or rales.  Musculoskeletal: Normal  range of motion.     Right lower leg: No edema.     Left lower leg: No edema.  Skin:    General: Skin is warm and dry.     Findings: No rash.  Neurological:     General: No focal deficit present.     Mental Status: She is alert.     Cranial Nerves: Cranial nerves are intact.     Sensory: Sensation is intact.     Motor: Motor function is intact.     Coordination: Coordination is intact. Romberg sign negative. Coordination normal.     Gait: Gait is intact.     Comments: CN 2-12 intact FTN intact EOMI No pronator drift  Psychiatric:        Mood and Affect: Mood normal.        Behavior: Behavior normal.       Results for orders placed or performed in visit on 07/31/18  Comprehensive metabolic panel  Result Value Ref Range   Sodium 139 135 - 145 mEq/L   Potassium 3.9 3.5 - 5.1 mEq/L   Chloride 107 96 - 112 mEq/L   CO2 28 19 - 32 mEq/L   Glucose, Bld 83 70 - 99 mg/dL   BUN 11 6 - 23 mg/dL   Creatinine, Ser 3.26 0.40 - 1.20 mg/dL   Total Bilirubin 0.4 0.2 - 1.2 mg/dL   Alkaline Phosphatase 54 39 - 117 U/L   AST 16 0 - 37 U/L   ALT 16 0 - 35 U/L   Total Protein 6.8 6.0 - 8.3 g/dL   Albumin 4.4 3.5 - 5.2 g/dL   Calcium 9.4 8.4 - 71.2 mg/dL   GFR 45.80 >99.83 mL/min  TSH  Result Value Ref Range   TSH 0.77 0.35 - 4.50 uIU/mL  CBC with Differential/Platelet  Result Value Ref Range   WBC 6.6 4.0 - 10.5 K/uL   RBC 4.13 3.87 - 5.11 Mil/uL   Hemoglobin 13.9 12.0 - 15.0 g/dL   HCT 38.2 50.5 - 39.7 %   MCV 100.3 (H) 78.0 - 100.0 fl   MCHC 33.5 30.0 - 36.0 g/dL   RDW 67.3 41.9 - 37.9 %   Platelets 289.0 150.0 - 400.0 K/uL   Neutrophils Relative % 40.5 (L) 43.0 - 77.0 %   Lymphocytes Relative 48.8 (H) 12.0 - 46.0 %   Monocytes Relative 8.1 3.0 - 12.0 %   Eosinophils Relative 1.7 0.0 - 5.0 %   Basophils Relative 0.9 0.0 - 3.0 %   Neutro Abs 2.7 1.4 - 7.7 K/uL   Lymphs Abs 3.2 0.7 - 4.0 K/uL   Monocytes Absolute  0.5 0.1 - 1.0 K/uL   Eosinophils Absolute 0.1 0.0 - 0.7 K/uL    Basophils Absolute 0.1 0.0 - 0.1 K/uL   EKG - NSR rate 80s, normal axis, 2nd degree AV block (type 2) Assessment & Plan:   Problem List Items Addressed This Visit    Weight loss, unintentional    Nearly 60 lb weight loss over the last 3 years (since last seen here) without localizing symptoms other than intermittent fatigue. Pt attributes to high stress of changing jobs and caring for son as single mother. Known fmhx breast cancer. Will start eval with labs (CBC, CMP, TSH) and refer to GYN for well woman exam. Consider iFOB.       Syncope    EKG showing high grade second degree block - see below. Check labwork.       Relevant Orders   EKG 12-Lead (Completed)   Comprehensive metabolic panel (Completed)   TSH (Completed)   CBC with Differential/Platelet (Completed)   Ambulatory referral to Cardiology   Smoker    Encouraged cessation - she states she is working towards this and has been able to decrease to 5 cig/day.       Second degree AV block, Mobitz type II - Primary    Noted on EKG today - check labs (CBC, CMP, TSH). Unable to add lyme disease testing - consider checking. Pt endorses tick bite last year but without significant symptoms at that time.       Relevant Orders   Ambulatory referral to Cardiology   Anxiety    This is managed by psychiatry/psychology. Reviewed dangers of abrupt discontinuation of benzodiazepine.        Other Visit Diagnoses    Need for influenza vaccination       Relevant Orders   Flu Vaccine QUAD 36+ mos IM (Completed)   Well woman exam       Relevant Orders   Ambulatory referral to Gynecology       No orders of the defined types were placed in this encounter.  Orders Placed This Encounter  Procedures  . Flu Vaccine QUAD 36+ mos IM  . Comprehensive metabolic panel  . TSH  . CBC with Differential/Platelet  . Ambulatory referral to Cardiology    Referral Priority:   Urgent    Referral Type:   Consultation    Referral Reason:    Specialty Services Required    Requested Specialty:   Cardiology    Number of Visits Requested:   1  . Ambulatory referral to Gynecology    Referral Priority:   Routine    Referral Type:   Consultation    Referral Reason:   Specialty Services Required    Requested Specialty:   Gynecology    Number of Visits Requested:   1  . EKG 12-Lead    Follow up plan: Return in about 4 weeks (around 08/28/2018) for follow up visit.  Eustaquio BoydenJavier Rayanne Padmanabhan, MD

## 2018-08-01 ENCOUNTER — Encounter: Payer: Self-pay | Admitting: Cardiology

## 2018-08-01 ENCOUNTER — Ambulatory Visit (INDEPENDENT_AMBULATORY_CARE_PROVIDER_SITE_OTHER): Payer: 59 | Admitting: Cardiology

## 2018-08-01 VITALS — BP 118/62 | HR 85 | Ht 67.0 in | Wt 141.0 lb

## 2018-08-01 DIAGNOSIS — F419 Anxiety disorder, unspecified: Secondary | ICD-10-CM | POA: Insufficient documentation

## 2018-08-01 DIAGNOSIS — R55 Syncope and collapse: Secondary | ICD-10-CM

## 2018-08-01 DIAGNOSIS — F17200 Nicotine dependence, unspecified, uncomplicated: Secondary | ICD-10-CM

## 2018-08-01 DIAGNOSIS — R0789 Other chest pain: Secondary | ICD-10-CM

## 2018-08-01 HISTORY — DX: Anxiety disorder, unspecified: F41.9

## 2018-08-01 HISTORY — DX: Other chest pain: R07.89

## 2018-08-01 NOTE — Assessment & Plan Note (Addendum)
Nearly 60 lb weight loss over the last 3 years (since last seen here) without localizing symptoms other than intermittent fatigue. Pt attributes to high stress of changing jobs and caring for son as single mother. Known fmhx breast cancer. Will start eval with labs (CBC, CMP, TSH) and refer to GYN for well woman exam. Consider iFOB.

## 2018-08-01 NOTE — Progress Notes (Signed)
Cardiology Office Note:    Date:  08/01/2018   ID:  Anna KindsSydney Bass, DOB 07/04/1986, MRN [id number]  PCP:  Eustaquio BoydenGutierrez, Javier, MD  Cardiologist:  Garwin Brothersajan R Guss Farruggia, MD   Referring MD: Eustaquio BoydenGutierrez, Javier, MD    ASSESSMENT:    1. Syncope, unspecified syncope type   2. Smoker    PLAN:    In order of problems listed above:  1. I discussed my findings with the patient at extensive length.  My EKG done today was within normal limits.  Her syncopal episode is of concern and will need to be evaluated. 2. I will do an exercise stress echo to understand his symptoms of chest discomfort..  Her symptoms are atypical and occurred at rest without any exertion and not related to exertion.  No radiation of the symptoms to any part of the body. 3. She will have a 2-week monitoring to see if there is any issues with her cardiac conduction. 4. I told her to be in touch with her primary care for a complete syncope evaluation.  I advised her against driving in view of the above.  I spent 5 minutes with the patient discussing solely about smoking. Smoking cessation was counseled. I suggested to the patient also different medications and pharmacological interventions. Patient is keen to try stopping on its own at this time. He will get back to me if he needs any further assistance in this matter. 5. Patient will be seen in follow-up appointment in 6 months or earlier if the patient has any concerns.  She knows to go to the nearest emergency room for any significant concerns.   Medication Adjustments/Labs and Tests Ordered: Current medicines are reviewed at length with the patient today.  Concerns regarding medicines are outlined above.  No orders of the defined types were placed in this encounter.  No orders of the defined types were placed in this encounter.    History of Present Illness:    Anna KindsSydney Bass is a 33 y.o. female who is being seen today for the evaluation of abnormal EKG at the  request of Eustaquio BoydenGutierrez, Javier, MD.  Patient is a pleasant 33 year old female.  [facility] and she tells me that at around 830 she had a passing out spell there.  She denies any chest pain orthopnea or PND.  She mentions to me that she had had a meal at about 6:00.  She felt dizzy and lightheaded and hot flash sensation and fell on the floor.  Subsequently she woke up and had no problems after that.  She has had a similar episode about 3 years ago.  At the time of my evaluation, the patient is alert awake oriented and in no distress.  Unfortunately she is a active smoker and has smoked for a long time.  Past Medical History:  Diagnosis Date  . Depression   . Frequent headaches   . Obesity, Class I, BMI 30-34.9 09/25/2015  . Smoker 09/25/2015    Past Surgical History:  Procedure Laterality Date  . CHALAZION EXCISION      Current Medications: Current Meds  Medication Sig  . buPROPion (WELLBUTRIN SR) 150 MG 12 hr tablet Take 150 mg by mouth 2 (two) times daily.  . clonazePAM (KLONOPIN) 1 MG tablet Take 1 tablet (1 mg total) by mouth 2 (two) times daily.     Allergies:   Sulfa antibiotics   Social History   Socioeconomic History  . Marital status: Single    Spouse  name: Not on file  . Number of children: Not on file  . Years of education: Not on file  . Highest education level: Not on file  Occupational History  . Not on file  Social Needs  . Financial resource strain: Not on file  . Food insecurity:    Worry: Not on file    Inability: Not on file  . Transportation needs:    Medical: Not on file    Non-medical: Not on file  Tobacco Use  . Smoking status: Current Every Day Smoker    Packs/day: 0.50    Years: 10.00    Pack years: 5.00    Types: Cigarettes    Start date: 07/26/2006  . Smokeless tobacco: Never Used  Substance and Sexual Activity  . Alcohol use: Yes    Alcohol/week: 0.0 standard drinks    Comment: occasional  . Drug use: No    Comment: prior MJ use    . Sexual activity: Not on file  Lifestyle  . Physical activity:    Days per week: Not on file    Minutes per session: Not on file  . Stress: Not on file  Relationships  . Social connections:    Talks on phone: Not on file    Gets together: Not on file    Attends religious service: Not on file    Active member of club or organization: Not on file    Attends meetings of clubs or organizations: Not on file    Relationship status: Not on file  Other Topics Concern  . Not on file  Social History Narrative   Lives with cousin, son and mother   Edu: college - nursing --> communications degree   Occupation: Photographer: [employer]   Activity: no regular exercise   Diet: fruits/vegetables daily, some water     Family History: The patient's family history includes Cancer (age of onset: 86) in her mother; Cancer (age of onset: 96) in her maternal grandmother; Heart disease in her maternal grandfather; Leukemia (age of onset: 36) in her mother; Stroke in her maternal grandfather. There is no history of Diabetes.  ROS:   Please see the history of present illness.    All other systems reviewed and are negative.  EKGs/Labs/Other Studies Reviewed:    The following studies were reviewed today: EKG is not convincing for a second-degree AV block.  Therefore I repeated the EKG today and it was normal.   Recent Labs: 07/31/2018: ALT 16; BUN 11; Creatinine, Ser 0.91; Hemoglobin 13.9; Platelets 289.0; Potassium 3.9; Sodium 139; TSH 0.77  Recent Lipid Panel No results found for: CHOL, TRIG, HDL, CHOLHDL, VLDL, LDLCALC, LDLDIRECT  Physical Exam:    VS:  BP 118/62 (BP Location: Right Arm, Patient Position: Sitting, Cuff Size: Normal)   Pulse 85   Ht 5\' 7"  (1.702 m)   Wt 141 lb (64 kg)   LMP 07/25/2018   SpO2 98%   BMI 22.08 kg/m     Wt Readings from Last 3 Encounters:  08/01/18 141 lb (64 kg)  07/31/18 140 lb 4 oz (63.6 kg)  04/20/18 150 lb (68 kg)     GEN: Patient is in no  acute distress HEENT: Normal NECK: No JVD; No carotid bruits LYMPHATICS: No lymphadenopathy CARDIAC: S1 S2 regular, 2/6 systolic murmur at the apex. RESPIRATORY:  Clear to auscultation without rales, wheezing or rhonchi  ABDOMEN: Soft, non-tender, non-distended MUSCULOSKELETAL:  No edema; No deformity  SKIN: Warm and  dry NEUROLOGIC:  Alert and oriented x 3 PSYCHIATRIC:  Normal affect    Signed, Garwin Brothers, MD  08/01/2018 11:29 AM    [facility]

## 2018-08-01 NOTE — Assessment & Plan Note (Signed)
Noted on EKG today - check labs (CBC, CMP, TSH). Unable to add lyme disease testing - consider checking. Pt endorses tick bite last year but without significant symptoms at that time.

## 2018-08-01 NOTE — Assessment & Plan Note (Signed)
Encouraged cessation - she states she is working towards this and has been able to decrease to 5 cig/day.

## 2018-08-01 NOTE — Assessment & Plan Note (Signed)
This is managed by psychiatry/psychology. Reviewed dangers of abrupt discontinuation of benzodiazepine.

## 2018-08-01 NOTE — Assessment & Plan Note (Signed)
EKG showing high grade second degree block - see below. Check labwork.

## 2018-08-01 NOTE — Patient Instructions (Signed)
Medication Instructions:  Your physician recommends that you continue on your current medications as directed. Please refer to the Current Medication list given to you today.  If you need a refill on your cardiac medications before your next appointment, please call your pharmacy.   Lab work: None.  If you have labs (blood work) drawn today and your tests are completely normal, you will receive your results only by: . MyChart Message (if you have MyChart) OR . A paper copy in the mail If you have any lab test that is abnormal or we need to change your treatment, we will call you to review the results.  Testing/Procedures: Your physician has requested that you have a stress echocardiogram. For further information please visit [email or web address]. Please follow instruction sheet as given.  Your physician has recommended that you wear an event monitor. Event monitors are medical devices that record the heart's electrical activity. Doctors most often us these monitors to diagnose arrhythmias. Arrhythmias are problems with the speed or rhythm of the heartbeat. The monitor is a small, portable device. You can wear one while you do your normal daily activities. This is usually used to diagnose what is causing palpitations/syncope (passing out). Wear for 14 days.     Follow-Up: At [facility], you and your health needs are our priority.  As part of our continuing mission to provide you with exceptional heart care, we have created designated Provider Care Teams.  These Care Teams include your primary Cardiologist (physician) and Advanced Practice Providers (APPs -  Physician Assistants and Nurse Practitioners) who all work together to provide you with the care you need, when you need it. You will need a follow up appointment in 6 months.  Please call our office 2 months in advance to schedule this appointment.  You may see No primary care provider on file. or another member of our [facility]  Provider Team in [place]: Robert Krasowski, MD . Brian Munley, MD  Any Other Special Instructions Will Be Listed Below (If Applicable).   Exercise Stress Echocardiogram  An exercise stress echocardiogram is a test to check how well your heart is working. This test uses sound waves (ultrasound) and a computer to make images of your heart before and after exercise. Ultrasound images that are taken before you exercise (your resting echocardiogram) will show how much blood is getting to your heart muscle and how well your heart muscle and heart valves are functioning. During the next part of this test, you will walk on a treadmill or ride a stationary bike to see how exercise affects your heart. While you exercise, the electrical activity of your heart will be monitored with an electrocardiogram (ECG). Your blood pressure will also be monitored. You may have this test if you:  Have chest pain or other symptoms of a heart problem.  Recently had a heart attack or heart surgery.  Have heart valve problems.  Have a condition that causes narrowing of the blood vessels that supply your heart (coronary artery disease).  Have a high risk of heart disease and are starting a new exercise program.  Have a high risk of heart disease and need to have major surgery. Tell a health care provider about:  Any allergies you have.  All medicines you are taking, including vitamins, herbs, eye drops, creams, and over-the-counter medicines.  Any problems you or family members have had with anesthetic medicines.  Any blood disorders you have.  Any surgeries you have had.  Any   medical conditions you have.  Whether you are pregnant or may be pregnant. What are the risks? Generally, this is a safe procedure. However, problems may occur, including:  Chest pain.  Dizziness or light-headedness.  Shortness of breath.  Increased or irregular heartbeat (palpitations).  Nausea or vomiting.  Heart  attack (very rare). What happens before the procedure?  Follow instructions from your health care provider about eating or drinking restrictions. You may be asked to avoid all forms of caffeine for 24 hours before your procedure, or as told by your health care provider.  Ask your health care provider about changing or stopping your regular medicines. This is especially important if you are taking diabetes medicines or blood thinners.  If you use an inhaler, bring it with you to the test.  Wear loose, comfortable clothing and walking shoes.  Do notuse any products that contain nicotine or tobacco, such as cigarettes and e-cigarettes, for 4 hours before the test or as told by your health care provider. If you need help quitting, ask your health care provider. What happens during the procedure?  You will take off your clothes from the waist up and put on a hospital gown.  A technician will place electrodes on your chest.  A blood pressure cuff will be placed on your arm.  You will lie down on a table for an ultrasound exam before you exercise. Gel will be rubbed on your chest, and a handheld device (transducer) will be pressed against your chest and moved over your heart.  Then, you will start exercising by walking on a treadmill or pedaling a stationary bicycle.  Your blood pressure and heart rhythm will be monitored while you exercise.  The exercise will gradually get harder or faster.  You will exercise until: ? Your heart reaches a target level. ? You are too tired to continue. ? You cannot continue because of chest pain, weakness, or dizziness.  You will have another ultrasound exam after you stop exercising. The procedure may vary among health care providers and hospitals. What happens after the procedure?  Your heart rate and blood pressure will be monitored until they return to your normal levels. Summary  An exercise stress echocardiogram is a test that uses ultrasound  to check how well your heart works before and after exercise.  Before the test, follow instructions from your health care provider about stopping medications, avoiding nicotine and tobacco, and avoiding certain foods and drinks.  During the test, your blood pressure and heart rhythm will be monitored while you exercise on a treadmill or stationary bicycle. This information is not intended to replace advice given to you by your health care provider. Make sure you discuss any questions you have with your health care provider. Document Released: 07/16/2004 Document Revised: 03/03/2016 Document Reviewed: 03/03/2016 Elsevier Interactive Patient Education  2019 Elsevier Inc.   Ambulatory Cardiac Monitoring An ambulatory cardiac monitor is a small recording device that is used to detect abnormal heart rhythms (arrhythmias). Most monitors are connected by wires to flat, sticky disks (electrodes) that are then attached to your chest. You may need to wear a monitor if you have had symptoms such as:  Fast heartbeats (palpitations).  Dizziness.  Fainting or light-headedness.  Unexplained weakness.  Shortness of breath. There are several types of monitors. Some common monitors include:  Holter monitor. This records your heart rhythm continuously, usually for 24-48 hours.  Event (episodic) monitor. This monitor has a symptoms button, and when pushed, it will   begin recording. You need to activate this monitor to record when you have a heart-related symptom.  Automatic detection monitor. This monitor will begin recording when it detects an abnormal heartbeat. What are the risks? Generally, these devices are safe to use. However, it is possible that the skin under the electrodes will become irritated. How to prepare for monitoring Your health care provider will prepare your chest for the electrode placement and show you how to use the monitor.  Do not apply lotions to your chest before  monitoring.  Follow directions on how to care for the monitor, and how to return the monitor when the testing period is complete. How to use your cardiac monitor  Follow directions about how long to wear the monitor, and if you can take the monitor off in order to shower or bathe. ? Do not let the monitor get wet. ? Do not bathe, swim, or use a hot tub while wearing the monitor.  Keep your skin clean. Do not put body lotion or moisturizer on your chest.  Change the electrodes as told by your health care provider, or any time they stop sticking to your skin. You may need to use medical tape to keep them on.  Try to put the electrodes in slightly different places on your chest to help prevent skin irritation. Follow directions from your health care provider about where to place the electrodes.  Make sure the monitor is safely clipped to your clothing or in a location close to your body as recommended by your health care provider.  If your monitor has a symptoms button, press the button to mark an event as soon as you feel a heart-related symptom, such as: ? Dizziness. ? Weakness. ? Light-headedness. ? Palpitations. ? Thumping or pounding in your chest. ? Shortness of breath. ? Unexplained weakness.  Keep a diary of your activities, such as walking, doing chores, and taking medicine. It is very important to note what you were doing when you pushed the button to record your symptoms. This will help your health care provider determine what might be contributing to your symptoms.  Send the recorded information as recommended by your health care provider. It may take some time for your health care provider to process the results.  Change the batteries as told by your health care provider.  Keep electronic devices away from your monitor. These include: ? Tablets. ? MP3 players. ? Cell phones.  While wearing your monitor you should avoid: ? Electric blankets. ? Electric  razors. ? Electric toothbrushes. ? Microwave ovens. ? Magnets. ? Metal detectors. Get help right away if:  You have chest pain.  You have shortness of breath or extreme difficulty breathing.  You develop a very fast heartbeat that does not get better.  You develop dizziness that does not go away.  You faint or constantly feel like you are about to faint. Summary  An ambulatory cardiac monitor is a small recording device that is used to detect abnormal heart rhythms (arrhythmias).  Make sure you understand how to send the information from the monitor to your health care provider.  It is important to press the button on the monitor when you have any heart-related symptoms.  Keep a diary of your activities, such as walking, doing chores, and taking medicine. It is very important to note what you were doing when you pushed the button to record your symptoms. This will help your health care provider learn what might be causing your   symptoms. This information is not intended to replace advice given to you by your health care provider. Make sure you discuss any questions you have with your health care provider. Document Released: 04/20/2008 Document Revised: 04/27/2017 Document Reviewed: 06/26/2016 Elsevier Interactive Patient Education  2019 Elsevier Inc.   

## 2018-08-04 ENCOUNTER — Ambulatory Visit: Payer: Managed Care, Other (non HMO) | Admitting: Podiatry

## 2018-08-04 ENCOUNTER — Ambulatory Visit (INDEPENDENT_AMBULATORY_CARE_PROVIDER_SITE_OTHER): Payer: 59

## 2018-08-04 ENCOUNTER — Encounter: Payer: Self-pay | Admitting: Podiatry

## 2018-08-04 DIAGNOSIS — M722 Plantar fascial fibromatosis: Secondary | ICD-10-CM | POA: Diagnosis not present

## 2018-08-04 DIAGNOSIS — L989 Disorder of the skin and subcutaneous tissue, unspecified: Secondary | ICD-10-CM | POA: Diagnosis not present

## 2018-08-07 ENCOUNTER — Ambulatory Visit (HOSPITAL_BASED_OUTPATIENT_CLINIC_OR_DEPARTMENT_OTHER): Admission: RE | Admit: 2018-08-07 | Payer: 59 | Source: Ambulatory Visit

## 2018-08-07 NOTE — Progress Notes (Signed)
   Subjective: 33 year old female presenting today as a new patient with a chief complaint of shooting pain from the ball of her feet to the midfoot bilaterally that began 3-4 years ago. She reports the pain is due to callus lesions. Walking and applying pressure to the feet increases the pain. She has not had any recent treatment. Patient is here for further evaluation and treatment.   Past Medical History:  Diagnosis Date  . Depression   . Frequent headaches   . Obesity, Class I, BMI 30-34.9 09/25/2015  . Smoker 09/25/2015     Objective:  Physical Exam General: Alert and oriented x3 in no acute distress  Dermatology: Hyperkeratotic lesions present on the bilateral feet x 4. Pain on palpation with a central nucleated core noted. Skin is warm, dry and supple bilateral lower extremities. Negative for open lesions or macerations.  Vascular: Palpable pedal pulses bilaterally. No edema or erythema noted. Capillary refill within normal limits.  Neurological: Epicritic and protective threshold grossly intact bilaterally.   Musculoskeletal Exam: Pain on palpation at the keratotic lesion noted. Range of motion within normal limits bilateral. Muscle strength 5/5 in all groups bilateral.  Radiographic Exam:  Normal osseous mineralization. Joint spaces preserved. No fracture/dislocation/boney destruction.    Assessment: 1. Pre-ulcerative callus lesions bilateral x 4   Plan of Care:  1. Patient evaluated. X-Rays reviewed.  2. Excisional debridement of keratoic lesion using a chisel blade was performed without incident.  3. Dressed area with light dressing. 4. Recommended OTC corn and callus remover.  5. Recommended good shoe gear.  6. Patient is to return to the clinic PRN.   Felecia Shelling, DPM [facility]  Dr. Felecia Shelling, DPM    [address]                                        [place], [place] [address]                Office [phone]  Fax [phone]

## 2018-08-11 ENCOUNTER — Ambulatory Visit (HOSPITAL_BASED_OUTPATIENT_CLINIC_OR_DEPARTMENT_OTHER)
Admission: RE | Admit: 2018-08-11 | Discharge: 2018-08-11 | Disposition: A | Discharge status: Home / Self Care | Payer: 59 | Source: Ambulatory Visit | Attending: Cardiology | Admitting: Cardiology

## 2018-08-11 ENCOUNTER — Ambulatory Visit (INDEPENDENT_AMBULATORY_CARE_PROVIDER_SITE_OTHER): Payer: 59

## 2018-08-11 DIAGNOSIS — R55 Syncope and collapse: Secondary | ICD-10-CM

## 2018-08-11 DIAGNOSIS — R0789 Other chest pain: Secondary | ICD-10-CM | POA: Insufficient documentation

## 2018-08-11 NOTE — Progress Notes (Signed)
  Echocardiogram Echocardiogram Stress Test has been performed.  Anna Bass T Anna Bass 08/11/2018, 12:59 PM

## 2018-08-22 ENCOUNTER — Encounter: Payer: Self-pay | Admitting: Obstetrics and Gynecology

## 2018-08-22 ENCOUNTER — Ambulatory Visit: Payer: 59 | Admitting: Obstetrics and Gynecology

## 2018-08-28 ENCOUNTER — Ambulatory Visit (INDEPENDENT_AMBULATORY_CARE_PROVIDER_SITE_OTHER): Payer: 59 | Admitting: Family Medicine

## 2018-08-28 ENCOUNTER — Encounter: Payer: Self-pay | Admitting: Family Medicine

## 2018-08-28 VITALS — BP 112/68 | HR 102 | Temp 97.6°F | Ht 67.0 in | Wt 138.5 lb

## 2018-08-28 DIAGNOSIS — R8290 Unspecified abnormal findings in urine: Secondary | ICD-10-CM | POA: Diagnosis not present

## 2018-08-28 DIAGNOSIS — R634 Abnormal weight loss: Secondary | ICD-10-CM | POA: Diagnosis not present

## 2018-08-28 DIAGNOSIS — R55 Syncope and collapse: Secondary | ICD-10-CM

## 2018-08-28 DIAGNOSIS — Z113 Encounter for screening for infections with a predominantly sexual mode of transmission: Secondary | ICD-10-CM | POA: Diagnosis not present

## 2018-08-28 DIAGNOSIS — Z87891 Personal history of nicotine dependence: Secondary | ICD-10-CM

## 2018-08-28 LAB — POC URINALSYSI DIPSTICK (AUTOMATED)
Glucose, UA: NEGATIVE
Nitrite, UA: POSITIVE
PH UA: 6 (ref 5.0–8.0)
Protein, UA: POSITIVE — AB
Spec Grav, UA: 1.01 (ref 1.010–1.025)
Urobilinogen, UA: 2 E.U./dL — AB

## 2018-08-28 NOTE — Progress Notes (Addendum)
BP 112/68 (BP Location: Left Arm, Patient Position: Sitting, Cuff Size: Normal)   Pulse (!) 102   Temp 97.6 F (36.4 C) (Oral)   Ht 5\' 7"  (1.702 m)   Wt 138 lb 8 oz (62.8 kg)   LMP 08/22/2018   SpO2 97%   BMI 21.69 kg/m    CC: 1 mo f/u visit Subjective:    Patient ID: Anna Bass, female    DOB: 06-22-1986, 33 y.o.   MRN: [id number]  HPI: Leaundra Eernisse is a 33 y.o. female presenting on 08/28/2018 for Follow-up (Here for 4 wk f/u.)   See prior note for details.  Seen here last month after syncopal episode at [employer]. EKG concerning for high degree block, referred to cardiology. Cardiology did not think there was any AV block. Stress echo was reassuringly normal, heart monitor pending.   60 lb weight loss over the last 2 years in setting of significant stressors (she is a singe parent). Family is also concerned about rapid weight loss. Lab work last visit was unrevealing (CBC, CMP, TSH). Referred to [facility] - computer system was down at their appt, pending rescheduling.   Denies anorexia/bulemia or binge/purge history.  Denies dysphagia. No fevers/chills, bowel changes, blood or mucous in stool. Some early satiety. No globus sensation.  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 Denies other GI history.   Last week may have had the flu - sudden onset body aches, weakness, sweating, feverish, vomiting, cough and congestion. No appetite. Son sick as well at home. Today feeling better.   Anxiety - sees psychiatrist and counselor regularly - prescribed wellbutrin 150mg  bid and klonopin 1mg  bid.   LMP 1/28, normal, regular cycles. Denies intermenstrual spotting.  Currently sexually active, 2 partners in the last year.   She quit smoking!  Stress echo results: Left ventricular ejection fraction was normal at rest and with stress. There was no echocardiographic evidence for stress-induced ischemia.     Relevant past medical, surgical, family  and social history reviewed and updated as indicated. Interim medical history since our last visit reviewed. Allergies and medications reviewed and updated. Outpatient Medications Prior to Visit  Medication Sig Dispense Refill  . buPROPion (WELLBUTRIN SR) 150 MG 12 hr tablet Take 150 mg by mouth 2 (two) times daily.  5  . clonazePAM (KLONOPIN) 1 MG tablet Take 1 tablet (1 mg total) by mouth 2 (two) times daily.     No facility-administered medications prior to visit.      Per HPI unless specifically indicated in ROS section below Review of Systems Objective:    BP 112/68 (BP Location: Left Arm, Patient Position: Sitting, Cuff Size: Normal)   Pulse (!) 102   Temp 97.6 F (36.4 C) (Oral)   Ht 5\' 7"  (1.702 m)   Wt 138 lb 8 oz (62.8 kg)   LMP 08/22/2018   SpO2 97%   BMI 21.69 kg/m   Wt Readings from Last 3 Encounters:  08/28/18 138 lb 8 oz (62.8 kg)  08/22/18 148 lb (67.1 kg)  08/01/18 141 lb (64 kg)    Physical Exam Vitals signs and nursing note reviewed.  Constitutional:      Appearance: Normal appearance.  HENT:     Head: Normocephalic and atraumatic.     Mouth/Throat:     Mouth: Mucous membranes are moist.  Cardiovascular:     Rate and Rhythm: Normal rate and regular rhythm.     Pulses: Normal pulses.  Heart sounds: Normal heart sounds. No murmur.  Pulmonary:     Effort: Pulmonary effort is normal. No respiratory distress.     Breath sounds: Normal breath sounds. No wheezing, rhonchi or rales.  Abdominal:     General: Abdomen is flat. Bowel sounds are normal. There is no distension.     Palpations: Abdomen is soft. There is no mass.     Tenderness: There is no abdominal tenderness. There is no guarding or rebound.     Hernia: No hernia is present.  Skin:    General: Skin is warm and dry.     Findings: No rash.  Neurological:     Mental Status: She is alert.  Psychiatric:        Mood and Affect: Mood normal.       Results for orders placed or performed in  visit on 08/28/18  HIV Antibody (routine testing w rflx)  Result Value Ref Range   HIV 1&2 Ab, 4th Generation NON-REACTIVE NON-REACTI  POCT Urinalysis Dipstick (Automated)  Result Value Ref Range   Color, UA yellow    Clarity, UA clear    Glucose, UA Negative Negative   Bilirubin, UA 1+    Ketones, UA +/-    Spec Grav, UA 1.010 1.010 - 1.025   Blood, UA 3+    pH, UA 6.0 5.0 - 8.0   Protein, UA Positive (A) Negative   Urobilinogen, UA 2.0 (A) 0.2 or 1.0 E.U./dL   Nitrite, UA positive    Leukocytes, UA Large (3+) (A) Negative   Assessment & Plan:  RTC 2 mo CPE Problem List Items Addressed This Visit    Weight loss, unintentional - Primary    Ongoing (in setting of possible recent flu), pt attributes to life stressors. Recent labwork reassuring. Will further evaluate with iFOB, UA. Check HIV as well. She endorses recent healthy diet changes - has been drinking 1-2 protein supplement shakes as meal replacement some days.       Relevant Orders   Fecal occult blood, imunochemical   POCT Urinalysis Dipstick (Automated) (Completed)   Syncope    S/p reassuring cardiac evaluation. Event monitor results pending.       Ex-smoker    Congratulated on smoking cessation. Quit this past month.       Abnormal urinalysis    UA suggesting infection - UCx sent. No UTI sxs at this time. Will regardless treat with keflex course       Relevant Orders   Urine Culture    Other Visit Diagnoses    Screen for STD (sexually transmitted disease)       Relevant Orders   HIV Antibody (routine testing w rflx) (Completed)   RPR       Meds ordered this encounter  Medications  . cephALEXin (KEFLEX) 500 MG capsule    Sig: Take 1 capsule (500 mg total) by mouth 2 (two) times daily.    Dispense:  14 capsule    Refill:  0   Orders Placed This Encounter  Procedures  . Fecal occult blood, imunochemical    Standing Status:   Future    Standing Expiration Date:   08/29/2019  . Urine Culture  . HIV  Antibody (routine testing w rflx)  . RPR  . POCT Urinalysis Dipstick (Automated)    Patient Instructions  Call to reschedule OBGYN appointment.  Urinalysis today Pass by lab to pick up stool test. Labs today.  We will be in touch with results.  Return  in 2 months for physical.    Follow up plan: Return in about 2 months (around 10/27/2018) for annual exam, prior fasting for blood work.  Eustaquio BoydenJavier Cosandra Plouffe, MD

## 2018-08-28 NOTE — Patient Instructions (Addendum)
Call to reschedule OBGYN appointment.  Urinalysis today Pass by lab to pick up stool test. Labs today.  We will be in touch with results.  Return in 2 months for physical.

## 2018-08-29 DIAGNOSIS — R8290 Unspecified abnormal findings in urine: Secondary | ICD-10-CM | POA: Insufficient documentation

## 2018-08-29 LAB — RPR: RPR Ser Ql: REACTIVE — AB

## 2018-08-29 LAB — HIV ANTIBODY (ROUTINE TESTING W REFLEX): HIV 1&2 Ab, 4th Generation: NONREACTIVE

## 2018-08-29 LAB — FLUORESCENT TREPONEMAL AB(FTA)-IGG-BLD: Fluorescent Treponemal ABS: NONREACTIVE

## 2018-08-29 LAB — RPR TITER: RPR Titer: 1:2 {titer} — ABNORMAL HIGH

## 2018-08-29 MED ORDER — CEPHALEXIN 500 MG PO CAPS: 500.0000 mg | ORAL_CAPSULE | Freq: Two times a day (BID) | ORAL | 0 refills | Status: DC

## 2018-08-29 NOTE — Assessment & Plan Note (Addendum)
UA suggesting infection - UCx sent. No UTI sxs at this time. Will regardless treat with keflex course

## 2018-08-29 NOTE — Assessment & Plan Note (Addendum)
Ongoing (in setting of possible recent flu), pt attributes to life stressors. Recent labwork reassuring. Will further evaluate with iFOB, UA. Check HIV as well. She endorses recent healthy diet changes - has been drinking 1-2 protein supplement shakes as meal replacement some days.

## 2018-08-29 NOTE — Assessment & Plan Note (Addendum)
S/p reassuring cardiac evaluation. Event monitor results pending.

## 2018-08-29 NOTE — Assessment & Plan Note (Signed)
Congratulated on smoking cessation. Quit this past month.

## 2018-08-29 NOTE — Addendum Note (Signed)
Addended by: Eustaquio Boyden on: 08/29/2018 08:41 AM   Modules accepted: Orders, Level of Service

## 2018-08-30 LAB — URINE CULTURE
MICRO NUMBER:: 141720
SPECIMEN QUALITY:: ADEQUATE

## 2018-08-31 ENCOUNTER — Other Ambulatory Visit: Payer: Self-pay | Admitting: Family Medicine

## 2018-08-31 ENCOUNTER — Telehealth: Payer: Self-pay

## 2018-08-31 MED ORDER — NITROFURANTOIN MONOHYD MACRO 100 MG PO CAPS: 100.0000 mg | ORAL_CAPSULE | Freq: Two times a day (BID) | ORAL | 0 refills | Status: DC

## 2018-08-31 NOTE — Telephone Encounter (Signed)
Called patient and left detailed voice message on patients phone regarding test results. 

## 2018-08-31 NOTE — Telephone Encounter (Signed)
-----   Message from Garwin Brothersajan R Revankar, MD sent at 08/31/2018 12:48 PM EST ----- The results of the study is unremarkable. Please inform patient. I will discuss in detail at next appointment. Cc  primary care/referring physician Garwin Brothersajan R Revankar, MD 08/31/2018 12:48 PM

## 2018-09-05 ENCOUNTER — Telehealth: Payer: Self-pay | Admitting: Obstetrics and Gynecology

## 2018-09-05 NOTE — Telephone Encounter (Signed)
[facility] referring for Women's wellness exam. Called and left voicemail for patient to call back to be schedule. Patient cancelled appointment schedule on 08/22/18. Stating she would call back to reschedule.

## 2018-09-29 ENCOUNTER — Telehealth: Payer: Self-pay | Admitting: Family Medicine

## 2018-09-29 NOTE — Telephone Encounter (Signed)
Encounter was for weight loss and passing out. Did not have rash Confirmatory test - Fluorescent Treponemal ABS - returned negative consistent with false positive result.  Thanks.

## 2018-09-29 NOTE — Telephone Encounter (Signed)
Spoke [place], of [facility] in [place], relaying Dr. Timoteo Expose message. Says they need a copy of the Flurescent Treponemal ABS lab faxed to him at [phone].   Faxed results.

## 2018-09-29 NOTE — Telephone Encounter (Signed)
Returned Cory's call.  He states pt was seen at our office and had a reactive RPR about 1 mo ago.  He is asking if pt has hx of sxs and/or was she treated. Also, what was her chief complaint? Did she have any spots/rash?

## 2018-09-29 NOTE — Telephone Encounter (Signed)
[facility] Called wanting to speak with Dr Reece Agar or his medical assistant.  Please call [facility] @ [phone]

## 2018-10-02 NOTE — Progress Notes (Deleted)
PCP:  Eustaquio Boyden, MD   No chief complaint on file.    HPI:      Ms. Anna Bass is a 33 y.o. No obstetric history on file. who LMP was No LMP recorded., presents today for her NP annual examination.  Her menses are {norm/abn:715}, lasting {number:22536} days.  Dysmenorrhea {dysmen:716}. She {does:18564} have intermenstrual bleeding.  Sex activity: {sex active:315163}.  Last Pap: {KNLZ:767341937}  Results were: {norm/abn:16707::"no abnormalities"} /neg HPV DNA *** Hx of STDs: {STD hx:14358}  Last mammogram: {date:304500300}  Results were: {norm/abn:13465} There is a FH of breast cancer in her mom and MGM There is no FH of ovarian cancer. The patient {does:18564} do self-breast exams.  Tobacco use: {tob:20664} Alcohol use: {Alcohol:11675} No drug use.  Exercise: {exercise:31265}  She {does:18564} get adequate calcium and Vitamin D in her diet.   Past Medical History:  Diagnosis Date  . Depression   . Frequent headaches   . Obesity, Class I, BMI 30-34.9 09/25/2015  . Smoker 09/25/2015    Past Surgical History:  Procedure Laterality Date  . CHALAZION EXCISION      Family History  Problem Relation Age of Onset  . Cancer Mother 48       breast  . Leukemia Mother 97       found during dengue fever treatment  . Cancer Maternal Grandmother 48       breast and brain  . Stroke Maternal Grandfather   . Heart disease Maternal Grandfather        ?stent or pacer  . Diabetes Neg Hx     Social History   Socioeconomic History  . Marital status: Single    Spouse name: Not on file  . Number of children: Not on file  . Years of education: Not on file  . Highest education level: Not on file  Occupational History  . Not on file  Social Needs  . Financial resource strain: Not on file  . Food insecurity:    Worry: Not on file    Inability: Not on file  . Transportation needs:    Medical: Not on file    Non-medical: Not on file  Tobacco Use  . Smoking  status: Former Smoker    Packs/day: 0.50    Years: 10.00    Pack years: 5.00    Types: Cigarettes    Start date: 07/26/2006    Last attempt to quit: 08/02/2018    Years since quitting: 0.1  . Smokeless tobacco: Never Used  Substance and Sexual Activity  . Alcohol use: Yes    Alcohol/week: 0.0 standard drinks    Comment: occasional  . Drug use: No    Comment: prior MJ use  . Sexual activity: Not on file  Lifestyle  . Physical activity:    Days per week: Not on file    Minutes per session: Not on file  . Stress: Not on file  Relationships  . Social connections:    Talks on phone: Not on file    Gets together: Not on file    Attends religious service: Not on file    Active member of club or organization: Not on file    Attends meetings of clubs or organizations: Not on file    Relationship status: Not on file  . Intimate partner violence:    Fear of current or ex partner: Not on file    Emotionally abused: Not on file    Physically abused: Not on file  Forced sexual activity: Not on file  Other Topics Concern  . Not on file  Social History Narrative   Lives with cousin, son and mother   Edu: college - nursing --> communications degree   Occupation: Photographer: [employer]   Activity: no regular exercise   Diet: fruits/vegetables daily, some water    Outpatient Medications Prior to Visit  Medication Sig Dispense Refill  . buPROPion (WELLBUTRIN SR) 150 MG 12 hr tablet Take 150 mg by mouth 2 (two) times daily.  5  . clonazePAM (KLONOPIN) 1 MG tablet Take 1 tablet (1 mg total) by mouth 2 (two) times daily.    . nitrofurantoin, macrocrystal-monohydrate, (MACROBID) 100 MG capsule Take 1 capsule (100 mg total) by mouth 2 (two) times daily. 14 capsule 0   No facility-administered medications prior to visit.       ROS:  Review of Systems BREAST: No symptoms   Objective: There were no vitals taken for this visit.   OBGyn Exam  Results: No results found  for this or any previous visit (from the past 24 hour(s)).  Assessment/Plan: No diagnosis found.  No orders of the defined types were placed in this encounter.            GYN counsel {counseling:16159}     F/U  No follow-ups on file.  Alicia B. Copland, PA-C 10/02/2018 3:11 PM

## 2018-10-03 ENCOUNTER — Other Ambulatory Visit (INDEPENDENT_AMBULATORY_CARE_PROVIDER_SITE_OTHER): Payer: 59

## 2018-10-03 ENCOUNTER — Encounter: Payer: 59 | Admitting: Obstetrics and Gynecology

## 2018-10-03 DIAGNOSIS — R634 Abnormal weight loss: Secondary | ICD-10-CM

## 2018-10-03 LAB — FECAL OCCULT BLOOD, IMMUNOCHEMICAL: FECAL OCCULT BLD: NEGATIVE

## 2018-10-03 LAB — FECAL OCCULT BLOOD, GUAIAC: Fecal Occult Blood: NEGATIVE

## 2018-10-09 ENCOUNTER — Encounter: Payer: Self-pay | Admitting: Family Medicine

## 2018-11-21 ENCOUNTER — Other Ambulatory Visit: Payer: 59

## 2018-11-21 ENCOUNTER — Encounter: Payer: Self-pay | Admitting: Family Medicine

## 2018-11-21 ENCOUNTER — Ambulatory Visit (INDEPENDENT_AMBULATORY_CARE_PROVIDER_SITE_OTHER): Payer: 59 | Admitting: Obstetrics and Gynecology

## 2018-11-21 ENCOUNTER — Telehealth: Payer: Self-pay

## 2018-11-21 ENCOUNTER — Other Ambulatory Visit: Payer: Self-pay

## 2018-11-21 ENCOUNTER — Encounter: Payer: Self-pay | Admitting: Obstetrics and Gynecology

## 2018-11-21 ENCOUNTER — Other Ambulatory Visit (HOSPITAL_COMMUNITY)
Admission: RE | Admit: 2018-11-21 | Discharge: 2018-11-21 | Disposition: A | Discharge status: Home / Self Care | Payer: 59 | Source: Ambulatory Visit | Attending: Obstetrics and Gynecology | Admitting: Obstetrics and Gynecology

## 2018-11-21 ENCOUNTER — Ambulatory Visit (INDEPENDENT_AMBULATORY_CARE_PROVIDER_SITE_OTHER): Payer: 59 | Admitting: Family Medicine

## 2018-11-21 VITALS — Ht 67.0 in | Wt 137.0 lb

## 2018-11-21 VITALS — BP 102/68 | Ht 67.0 in | Wt 137.0 lb

## 2018-11-21 DIAGNOSIS — R350 Frequency of micturition: Secondary | ICD-10-CM

## 2018-11-21 DIAGNOSIS — N921 Excessive and frequent menstruation with irregular cycle: Secondary | ICD-10-CM | POA: Diagnosis present

## 2018-11-21 DIAGNOSIS — F419 Anxiety disorder, unspecified: Secondary | ICD-10-CM | POA: Diagnosis not present

## 2018-11-21 DIAGNOSIS — Z113 Encounter for screening for infections with a predominantly sexual mode of transmission: Secondary | ICD-10-CM

## 2018-11-21 DIAGNOSIS — R109 Unspecified abdominal pain: Secondary | ICD-10-CM

## 2018-11-21 DIAGNOSIS — Z803 Family history of malignant neoplasm of breast: Secondary | ICD-10-CM

## 2018-11-21 HISTORY — DX: Unspecified abdominal pain: R10.9

## 2018-11-21 LAB — POCT URINALYSIS DIPSTICK
Glucose, UA: NEGATIVE
Ketones, UA: NEGATIVE
Leukocytes, UA: NEGATIVE
Nitrite, UA: NEGATIVE
Protein, UA: NEGATIVE
Spec Grav, UA: 1.01 (ref 1.010–1.025)
pH, UA: 6 (ref 5.0–8.0)

## 2018-11-21 LAB — POC URINALSYSI DIPSTICK (AUTOMATED)
Bilirubin, UA: NEGATIVE
Glucose, UA: NEGATIVE
Ketones, UA: NEGATIVE
Nitrite, UA: NEGATIVE
Protein, UA: NEGATIVE
Spec Grav, UA: 1.01 (ref 1.010–1.025)
Urobilinogen, UA: 0.2 E.U./dL
pH, UA: 8.5 — AB (ref 5.0–8.0)

## 2018-11-21 NOTE — Telephone Encounter (Signed)
Patient has appointment with D.r G today at 4pm for doxy.me visit with lab 1-2 hours prior.   FYI to Dr. Reece Agar.

## 2018-11-21 NOTE — Telephone Encounter (Signed)
Appears patient has an appointment with GYN at 930 today as well.  I have tried to reach out to patient regarding this appointment as she will not be able to see both GYN and Dr. Reece Agar for the same problem in the same day.  Unable to reach patient at number, line is busy.

## 2018-11-21 NOTE — Assessment & Plan Note (Signed)
She is back on wellbutrin/klonopin. Advised of dangers of stopping these medications suddenly. She regularly sees psychiatry/psychology. Support provided during difficult time.

## 2018-11-21 NOTE — Progress Notes (Signed)
Virtual visit completed through Doxy.Me. Due to national recommendations of social distancing due to COVID 19, a virtual visit is felt to be most appropriate for this patient at this time.   Patient location: home Provider location: [place] at [facility], office If any vitals were documented, they were collected by patient at home unless specified below.    Ht 5\' 7"  (1.702 m)   Wt 137 lb (62.1 kg)   LMP 11/12/2018 (Exact Date)   BMI 21.46 kg/m    CC: abd pain/cramping Subjective:    Patient ID: Anna Bass, female    DOB: 07/04/1986, 33 y.o.   MRN: [id number]  HPI: Anna Bass is a 33 y.o. female presenting on 11/21/2018 for Abdominal Pain (C/o LLQ abd cramping and spotting. Sxs started yesterday. Seen by OB/GYN today.) and Urinary Frequency (C/o urinary frequency and urgency. States she had UTI about 1 mo, cleared after tx. )   1d h/o LLQ abd cramping and spotting. Today feeling better.  LMP Friday 11/17/2018.  Usually regular monthly periods - last 5-6 days. Heavy bleeding initially then lightens up.   No fevers/chills, dysuria, urgency, frequency. No hematuria. No flank pain, nausea/vomiting. No bowel changes, diarrhea/consipation, blood in stool.  No recent diet changes.  Weight has been stable recently.   Saw OBGYN this morning for these symptoms - note reviewed. Negative Upreg and UA. Had STD screen done. Had genetic test done for fmhx breast cancer.   She was worried about rpt UTI (last one 08/2018 treated with macrobid with resolution).   She has continued working - her 57 yo son is staying with a friend in [place] while she works, but she is able to stay with son several days a week (on days she's off). This has been difficult for her. She has recently restarted wellbutrin/klonopin - had been off for several weeks.   Currently sexually active uses protection but not 100% of the time. Pending STD tests from this morning.      Relevant past medical,  surgical, family and social history reviewed and updated as indicated. Interim medical history since our last visit reviewed. Allergies and medications reviewed and updated. Outpatient Medications Prior to Visit  Medication Sig Dispense Refill  . buPROPion (WELLBUTRIN SR) 200 MG 12 hr tablet TK 1 T PO  BID    . clonazePAM (KLONOPIN) 0.5 MG tablet TK 1 T PO BID     No facility-administered medications prior to visit.      Per HPI unless specifically indicated in ROS section below Review of Systems Objective:    Ht 5\' 7"  (1.702 m)   Wt 137 lb (62.1 kg)   LMP 11/12/2018 (Exact Date)   BMI 21.46 kg/m   Wt Readings from Last 3 Encounters:  11/21/18 137 lb (62.1 kg)  11/21/18 137 lb (62.1 kg)  08/28/18 138 lb 8 oz (62.8 kg)     Physical exam: Gen: alert, NAD, not ill appearing Pulm: speaks in complete sentences without increased work of breathing Psych: normal mood, normal thought content      Results for orders placed or performed in visit on 11/21/18  POCT Urinalysis Dipstick (Automated)  Result Value Ref Range   Color, UA yellow    Clarity, UA cloudy    Glucose, UA Negative Negative   Bilirubin, UA negative    Ketones, UA negative    Spec Grav, UA 1.010 1.010 - 1.025   Blood, UA 1+    pH, UA 8.5 (A) 5.0 - 8.0  Protein, UA Negative Negative   Urobilinogen, UA 0.2 0.2 or 1.0 E.U./dL   Nitrite, UA negative    Leukocytes, UA Small (1+) (A) Negative   Assessment & Plan:   Problem List Items Addressed This Visit    Anxiety    She is back on wellbutrin/klonopin. Advised of dangers of stopping these medications suddenly. She regularly sees psychiatry/psychology. Support provided during difficult time.       Abdominal cramping - Primary    Some spotting after regular period. Reassuring OB eval this morning. Will monitor symptoms. She is not on OCP.        Other Visit Diagnoses    Urinary frequency       Relevant Orders   POCT Urinalysis Dipstick (Automated)  (Completed)       No orders of the defined types were placed in this encounter.  Orders Placed This Encounter  Procedures  . POCT Urinalysis Dipstick (Automated)    Follow up plan: No follow-ups on file.  Eustaquio BoydenJavier Jamarious Febo, MD

## 2018-11-21 NOTE — Telephone Encounter (Signed)
Patient Name: Anna Bass Gender: Female DOB: 05-06-1986 Age: 33 Y 10 M 24 D Return Phone Number: [phone] (Primary) Address: City/State/Zip: [place] [address] Client [facility] Night - Client Client Site [facility] - Night Physician Eustaquio Boyden - MD Contact Type Call Who Is Calling Patient / Member / Family / Caregiver Call Type Triage / Clinical Relationship To Patient Self Return Phone Number [phone] (Primary) Chief Complaint Vaginal Bleeding Reason for Call Symptomatic / Request for Health Information Initial Comment Caller states has vaginal bleeding and thinks she has a uti. Translation No Nurse Assessment Nurse: Siri Cole, RN, Bjorn Loser Date/Time (Eastern Time): 11/21/2018 5:10:58 AM Confirm and document reason for call. If symptomatic, describe symptoms. ---Caller states that she is having vaginal bleeding. She reports that she just had a normal menstrual cycle. She reports she really didn't even notice it at first. She reports only a slight discomfort in her lower abdomen. Also, she thinks she might have a UTI because her urine has an odor. She does not feel like she has a fever. Has the patient had close contact with a person known or suspected to have the novel coronavirus illness OR traveled / lives in area with major community spread (including international travel) in the last 14 days from the onset of symptoms? * If Asymptomatic, screen for exposure and travel within the last 14 days. ---No Does the patient have any new or worsening symptoms? ---Yes Will a triage be completed? ---Yes Related visit to physician within the last 2 weeks? ---No Does the PT have any chronic conditions? (i.e. diabetes, asthma, this includes High risk factors for pregnancy, etc.) ---No Is the patient pregnant or possibly pregnant? (Ask all females between the ages of 26-55) ---No Is this a behavioral health or  substance abuse call? ---No Guidelines Guideline Title Affirmed Question Affirmed Notes Nurse Date/Time Lamount Cohen Time) Urinary Symptoms Bad or foul-smelling urine Siri Cole, RN, Bjorn Loser 11/21/2018 5:24:49 AM PLEASE NOTE: All timestamps contained within this report are represented as [place] Standard Time. CONFIDENTIALTY NOTICE: This fax transmission is intended only for the addressee. It contains information that is legally privileged, confidential or otherwise protected from use or disclosure. If you are not the intended recipient, you are strictly prohibited from reviewing, disclosing, copying using or disseminating any of this information or taking any action in reliance on or regarding this information. If you have received this fax in error, please notify us immediately by telephone so that we can arrange for its return to [place]. Phone: [phone], Toll-Free: [phone], Fax: [phone] Page: 2 of 2 Call Id: [id number] Disp. Time Lamount Cohen Time) Disposition Final User 11/21/2018 5:29:30 AM See PCP within 24 Hours Yes Siri Cole, RN, Juliene Pina Disagree/Comply Comply Caller Understands Yes PreDisposition Did not know what to do Care Advice Given Per Guideline SEE PCP WITHIN 24 HOURS: * IF OFFICE WILL BE OPEN: You need to be seen within the next 24 hours. Call your doctor (or NP/PA) when the office opens and make an appointment. CARE ADVICE given per Urinary Symptoms (Adult) guideline. Comments User: Carmelia Bake, RN Date/Time (Eastern Time): 11/21/2018 5:30:14 AM Caller hung up before Disposition could be completed. Referrals REFERRED TO PCP OFFICE

## 2018-11-21 NOTE — Patient Instructions (Signed)
I value your feedback and entrusting us with your care. If you get a [facility] patient survey, I would appreciate you taking the time to let us know about your experience today. Thank you! 

## 2018-11-21 NOTE — Progress Notes (Signed)
Eustaquio BoydenGutierrez, Javier, MD   Chief Complaint  Patient presents with  . Vaginal Bleeding    started spotting yesterday, slight cramping  . Urinary Tract Infection    frequency, spotting mostly when wipes, no burning sensation when urinates, sweet odor, no lower back pain x 1 week    HPI:      Ms. Anna Bass is a 33 y.o. G1P1001 who LMP was Patient's last menstrual period was 11/12/2018 (exact date)., presents today for NP eval of irregular bleeding since yesterday with a little cramping. Flow is light. Menses are usually monthly, lasting 5-6 days, mod flow, no BTB, mild dysmen improved with NSAIDs. LMP was shorter and with a large clot, unusual for pt. Pt had neg home UPT yesterday. Normal thyroid labs with PCP 2/20. Pt with increased stress due to covid. Pt is sex active, using condoms mostly. Pt denies any vag sx, LBP, fevers. Has had urinary frequency over the past wk with good flow. No dysuria, hematuria, urgency. Hx of UTIs in past but sx not the same.   Hx of significant wt loss this year but stable wt today since 2/20 appt with PCP.  Pt has annual sched for 6/20 due to covid. Pt with FH breast cancer in her mom and MGM, genetic testing not done. Pt interested today.  Past Medical History:  Diagnosis Date  . Depression   . Frequent headaches   . Obesity, Class I, BMI 30-34.9 09/25/2015   resolved  . Smoker 09/25/2015    Past Surgical History:  Procedure Laterality Date  . CHALAZION EXCISION      Family History  Problem Relation Age of Onset  . Cancer Mother 10845       breast  . Leukemia Mother 6049       found during dengue fever treatment  . Cancer Maternal Grandmother 48       breast and brain  . Stroke Maternal Grandfather   . Heart disease Maternal Grandfather        ?stent or pacer  . Breast cancer Paternal Grandmother 4142  . Brain cancer Paternal Grandmother 7342  . Prostate cancer Paternal Grandfather 867  . Ovarian cysts Sister   . Diabetes Neg Hx   .  Ovarian cancer Neg Hx     Social History   Socioeconomic History  . Marital status: Single    Spouse name: Not on file  . Number of children: Not on file  . Years of education: Not on file  . Highest education level: Not on file  Occupational History  . Not on file  Social Needs  . Financial resource strain: Not on file  . Food insecurity:    Worry: Not on file    Inability: Not on file  . Transportation needs:    Medical: Not on file    Non-medical: Not on file  Tobacco Use  . Smoking status: Former Smoker    Packs/day: 0.50    Years: 10.00    Pack years: 5.00    Types: Cigarettes    Start date: 07/26/2006    Last attempt to quit: 08/02/2018    Years since quitting: 0.3  . Smokeless tobacco: Never Used  Substance and Sexual Activity  . Alcohol use: Yes    Alcohol/week: 0.0 standard drinks    Comment: occasional  . Drug use: No    Comment: prior MJ use  . Sexual activity: Yes    Birth control/protection: Condom  Lifestyle  . Physical activity:  Days per week: Not on file    Minutes per session: Not on file  . Stress: Not on file  Relationships  . Social connections:    Talks on phone: Not on file    Gets together: Not on file    Attends religious service: Not on file    Active member of club or organization: Not on file    Attends meetings of clubs or organizations: Not on file    Relationship status: Not on file  . Intimate partner violence:    Fear of current or ex partner: Not on file    Emotionally abused: Not on file    Physically abused: Not on file    Forced sexual activity: Not on file  Other Topics Concern  . Not on file  Social History Narrative   Lives with cousin, son and mother   Edu: college - nursing --> communications degree   Occupation: Photographer: [employer]   Activity: no regular exercise   Diet: fruits/vegetables daily, some water    Outpatient Medications Prior to Visit  Medication Sig Dispense Refill  . buPROPion  (WELLBUTRIN SR) 200 MG 12 hr tablet TK 1 T PO  BID    . clonazePAM (KLONOPIN) 0.5 MG tablet TK 1 T PO BID    . buPROPion (WELLBUTRIN SR) 150 MG 12 hr tablet Take 150 mg by mouth 2 (two) times daily.  5  . clonazePAM (KLONOPIN) 1 MG tablet Take 1 tablet (1 mg total) by mouth 2 (two) times daily.    . nitrofurantoin, macrocrystal-monohydrate, (MACROBID) 100 MG capsule Take 1 capsule (100 mg total) by mouth 2 (two) times daily. 14 capsule 0   No facility-administered medications prior to visit.      ROS:  Review of Systems  Constitutional: Negative for fatigue, fever and unexpected weight change.  Respiratory: Negative for cough, shortness of breath and wheezing.   Cardiovascular: Negative for chest pain, palpitations and leg swelling.  Gastrointestinal: Positive for constipation and diarrhea. Negative for blood in stool, nausea and vomiting.  Endocrine: Negative for cold intolerance, heat intolerance and polyuria.  Genitourinary: Positive for frequency and menstrual problem. Negative for dyspareunia, dysuria, flank pain, genital sores, hematuria, pelvic pain, urgency, vaginal bleeding, vaginal discharge and vaginal pain.  Musculoskeletal: Positive for arthralgias. Negative for back pain, joint swelling and myalgias.  Skin: Negative for rash.  Neurological: Positive for dizziness and headaches. Negative for syncope, light-headedness and numbness.  Hematological: Negative for adenopathy.  Psychiatric/Behavioral: Positive for agitation and dysphoric mood. Negative for confusion, sleep disturbance and suicidal ideas. The patient is not nervous/anxious.   BREAST: No symptoms   OBJECTIVE:   Vitals:  BP 102/68   Ht  (1.702 m)   Wt 137 lb (62.1 kg)   LMP 11/12/2018 (Exact Date)   BMI 21.46 kg/m   Physical Exam Vitals signs reviewed.  Constitutional:      Appearance: She is well-developed.  Neck:     Musculoskeletal: Normal range of motion.  Pulmonary:     Effort: Pulmonary  effort is normal.  Genitourinary:    General: Normal vulva.     Pubic Area: No rash.      Labia:        Right: No rash, tenderness or lesion.        Left: No rash, tenderness or lesion.      Vagina: Bleeding present. No vaginal discharge, erythema or tenderness.     Cervix: Normal.  Uterus: Normal. Not enlarged and not tender.      Adnexa: Right adnexa normal and left adnexa normal.       Right: No mass or tenderness.         Left: No mass or tenderness.    Musculoskeletal: Normal range of motion.  Skin:    General: Skin is warm and dry.  Neurological:     General: No focal deficit present.     Mental Status: She is alert and oriented to person, place, and time.  Psychiatric:        Mood and Affect: Mood normal.        Behavior: Behavior normal.        Thought Content: Thought content normal.        Judgment: Judgment normal.     Results: Results for orders placed or performed in visit on 11/21/18 (from the past 24 hour(s))  POCT Urinalysis Dipstick     Status: Normal   Collection Time: 11/21/18 11:10 AM  Result Value Ref Range   Color, UA pale    Clarity, UA clear    Glucose, UA Negative Negative   Bilirubin, UA     Ketones, UA neg    Spec Grav, UA 1.010 1.010 - 1.025   Blood, UA trace    pH, UA 6.0 5.0 - 8.0   Protein, UA Negative Negative   Urobilinogen, UA     Nitrite, UA neg    Leukocytes, UA Negative Negative   Appearance     Odor      Assessment/Plan: Breakthrough bleeding - Since yesterday, neg UPT. Check STDs. If neg, most likely due to stress. Can f/u next cycle and sched u/s if sx persist. - Plan: Cervicovaginal ancillary only  Screening for STD (sexually transmitted disease) - Plan: Cervicovaginal ancillary only  Urinary frequency - With good flow, neg UA. Reassurance. F/u prn.  - Plan: POCT Urinalysis Dipstick  Family history of breast cancer - MyRisk testing discussed and done today. Will call with results. Has annual 6/20.    Return if  symptoms worsen or fail to improve.  Alicia B. Copland, PA-C 11/21/2018 11:13 AM

## 2018-11-21 NOTE — Assessment & Plan Note (Signed)
Some spotting after regular period. Reassuring OB eval this morning. Will monitor symptoms. She is not on OCP.

## 2018-11-23 LAB — CERVICOVAGINAL ANCILLARY ONLY
Chlamydia: NEGATIVE
Neisseria Gonorrhea: NEGATIVE

## 2018-11-24 DIAGNOSIS — Z1371 Encounter for nonprocreative screening for genetic disease carrier status: Secondary | ICD-10-CM

## 2018-11-24 DIAGNOSIS — Z9189 Other specified personal risk factors, not elsewhere classified: Secondary | ICD-10-CM

## 2018-11-24 HISTORY — DX: Encounter for nonprocreative screening for genetic disease carrier status: Z13.71

## 2018-11-24 HISTORY — DX: Other specified personal risk factors, not elsewhere classified: Z91.89

## 2018-12-12 ENCOUNTER — Encounter: Payer: Self-pay | Admitting: Obstetrics and Gynecology

## 2018-12-12 ENCOUNTER — Encounter: Payer: 59 | Admitting: Obstetrics and Gynecology

## 2018-12-28 ENCOUNTER — Telehealth: Payer: Self-pay | Admitting: Obstetrics and Gynecology

## 2018-12-28 NOTE — Telephone Encounter (Signed)
Trying to reach pt re: MyRisk testing results.

## 2018-12-30 ENCOUNTER — Other Ambulatory Visit: Payer: Self-pay | Admitting: *Deleted

## 2018-12-30 DIAGNOSIS — Z20822 Contact with and (suspected) exposure to covid-19: Secondary | ICD-10-CM

## 2019-01-01 LAB — NOVEL CORONAVIRUS, NAA: SARS-CoV-2, NAA: NOT DETECTED

## 2019-01-08 ENCOUNTER — Ambulatory Visit: Payer: 59 | Admitting: Obstetrics and Gynecology

## 2019-01-15 ENCOUNTER — Other Ambulatory Visit: Payer: Self-pay

## 2019-01-15 ENCOUNTER — Ambulatory Visit (INDEPENDENT_AMBULATORY_CARE_PROVIDER_SITE_OTHER): Payer: 59 | Admitting: Podiatry

## 2019-01-15 DIAGNOSIS — L989 Disorder of the skin and subcutaneous tissue, unspecified: Secondary | ICD-10-CM | POA: Diagnosis not present

## 2019-01-15 DIAGNOSIS — M7752 Other enthesopathy of left foot: Secondary | ICD-10-CM

## 2019-01-15 DIAGNOSIS — M7751 Other enthesopathy of right foot: Secondary | ICD-10-CM

## 2019-01-17 NOTE — Progress Notes (Signed)
   Subjective: 33 year old female presenting today for follow up evaluation of pre-ulcerative callus lesions of the bilateral feet. She states she needs the areas trimmed again because they are starting to cause pain. She also reports pain to the right arch that began about 5 months ago. Standing and walking increases the pain. She went to the [facility] and states they tried to sell her inserts. Patient is here for further evaluation and treatment.   Past Medical History:  Diagnosis Date  . BRCA negative 11/2018   MyRisk neg  . Depression   . Family history of breast cancer   . Frequent headaches   . Increased risk of breast cancer 11/2018   IBIS=29.2%  . Obesity, Class I, BMI 30-34.9 09/25/2015   resolved  . Smoker 09/25/2015     Objective:  Physical Exam General: Alert and oriented x3 in no acute distress  Dermatology: Hyperkeratotic lesions present on the bilateral feet x 4. Pain on palpation with a central nucleated core noted. Skin is warm, dry and supple bilateral lower extremities. Negative for open lesions or macerations.  Vascular: Palpable pedal pulses bilaterally. No edema or erythema noted. Capillary refill within normal limits.  Neurological: Epicritic and protective threshold grossly intact bilaterally.   Musculoskeletal Exam: Pain on palpation at the keratotic lesion noted as well as to the 5th MPJs of the bilateral feet. Range of motion within normal limits bilateral. Muscle strength 5/5 in all groups bilateral.  Assessment: 1. Pre-ulcerative callus lesions bilateral x 4 2. 5th MPJ capsulitis bilateral    Plan of Care:  1. Patient evaluated. X-Rays reviewed.  2. Excisional debridement of keratoic lesion using a chisel blade was performed without incident.  3. Dressed area with light dressing. 4. Appointment with Liliane Channel, Pedorthist, for custom molded orthotics.  5. Return to clinic in 3 months.   Edrick Kins, DPM [facility]  Dr. Edrick Kins, [address]                                        [place], [place] [address]                Office [phone]  Fax [phone]

## 2019-01-17 NOTE — Telephone Encounter (Signed)
Patient returning call, same cb.  

## 2019-01-18 NOTE — Telephone Encounter (Signed)
LMTRC

## 2019-01-20 NOTE — ED Notes (Signed)
Formatting of this note might be different from the original.  Staffing called for sitter. None available at this time  Electronically signed by Deneen Harts, RN at 01/20/2019  1:35 PM EDT

## 2019-01-20 NOTE — ED Notes (Signed)
Formatting of this note might be different from the original.  1 pink bag of belongings locked in B side closet. No valuables envelope  Electronically signed by Deneen Harts, RN at 01/20/2019  6:21 PM EDT

## 2019-01-20 NOTE — ED Notes (Signed)
Formatting of this note might be different from the original.  Pt dressed in to [facility] scrubs and belongings removed. Pt verbalized to this RN that she was not trying to kill herself however, if she were trying to kill herself, taking alot of pills to go to sleep would be the way to do it. Pt still appears slightly drowsy with unsteady gait.  Electronically signed by Deneen Harts, RN at 01/20/2019  4:49 PM EDT

## 2019-01-20 NOTE — ED Provider Notes (Signed)
Formatting of this note is different from the original.  [facility]    ED Provider Note    Wendy Martin 33 y.o. female DOB: May 05, 1986 MRN: [id number]  History     Chief Complaint   Patient presents with   ? Altered Mental Status     Pt was  brought in by  husband Per  husband  pt  took  approx  15  clonazpam  last  night  and other pills unk to him ETOH,weed and  cocaine Pt  talking  to herself     ? Overdose, Accidental     Pt  unsteady on  her  feet   ? Psychiatric Evaluation     Wendy Martin is a 33 y.o. female who presents to the emergency department with a chief complaint of altered mental status.  Patient was brought in by her husband.  He states she took approximately 15 clonazepam, drank alcohol, smoked marijuana and used cocaine yesterday.  Patient does appear altered here in the emergency department.  She is answering questions but is delayed in doing so.  She is able to tell me what she took.  She states she was not trying to commit suicide yesterday but did need "a break."  She states she took approximately 15 clonazepam throughout the day.  She is not sure when the last one was that she took.  She states she did smoke some marijuana and used some cocaine yesterday evening.  She denies using alcohol or other drugs.  Patient is unsteady on her feet and appears to be drowsy.  She denies any pain.  She states she is not sure why she is here. She denies any suicidal ideations at this time.     History provided by:  Patient  Altered Mental Status   Presenting symptoms: no confusion    Associated symptoms: no abdominal pain, no fever, no hallucinations, no headaches, no light-headedness, no nausea, no rash, no seizures, no vomiting and no weakness    Overdose, Accidental   Associated symptoms: altered mental status    Associated symptoms: no abdominal pain, no chest pain, no cough, no diarrhea, no headaches, no nausea, no shortness of breath and no vomiting     Psychiatric Evaluation   Presenting symptoms: no hallucinations and no suicidal thoughts    Associated symptoms: no abdominal pain, no anxiety, no appetite change, no chest pain, no fatigue and no headaches      Past Medical History:   Diagnosis Date   ? Depression    ? PTSD (post-traumatic stress disorder)      History reviewed. No pertinent surgical history.    Social History     Substance and Sexual Activity   Alcohol Use Yes     Social History     Tobacco Use   Smoking Status Current Every Day Smoker   Smokeless Tobacco Never Used     E-Cigarettes   ? Vaping Use Never User    ? Start Date     ? Cartridges/Day     ? Quit Date       Social History     Substance and Sexual Activity   Drug Use Yes   ? Types: Cocaine           Allergies   Allergen Reactions   ? Primaquine Anaphylaxis     Home Medications    BUPROPION (WELLBUTRIN SR) 200 MG 12 HR TABLET    Take 200 mg  by mouth 2 (two) times daily.    CLONAZEPAM (KLONOPIN) 0.5 MG TABLET    Take 0.5 mg by mouth 2 (two) times a day as needed.     Review of Systems     Review of Systems   Constitutional: Negative for activity change, appetite change, chills, fatigue and fever.   HENT: Negative for congestion and sore throat.    Eyes: Negative for photophobia and visual disturbance.   Respiratory: Negative for cough and shortness of breath.    Cardiovascular: Negative for chest pain.   Gastrointestinal: Negative for abdominal distention, abdominal pain, constipation, diarrhea, nausea and vomiting.   Genitourinary: Negative for difficulty urinating and dysuria.   Skin: Negative for rash.   Neurological: Negative for dizziness, seizures, syncope, facial asymmetry, weakness, light-headedness, numbness and headaches.        Altered    Psychiatric/Behavioral: Negative for confusion, hallucinations and suicidal ideas. The patient is not nervous/anxious.      Physical Exam     ED Triage Vitals [01/20/19 1352]   BP 137/78   Heart Rate 52   Resp 20   SpO2 99 %   Temp 98.6 F (37  C)     Physical Exam   Nursing note and vitals reviewed.  Constitutional: She appears well-developed and well-nourished. She does not appear distressed, does not appear ill and no respiratory distress. Not diaphoretic.  HENT:   Head: Normocephalic and atraumatic.   Neck: Normal range of motion.   Cardiovascular: Normal rate and normal heart sounds.   No audible murmur. No friction rub and gallop.   Pulmonary/Chest: No respiratory distress. Good air movement. Not tachypneic. Respiratory effort normal and breath sounds normal.   Neurological: She is alert. Cranial nerves intact II through XII. Strength 5/5 bilateral upper and lower extremities.   Patient is answering questions but is delayed in doing so and does appear drowsy.  She was able to tell me what drugs she took yesterday.  She thinks she is in [facility] but does know that it is June 2020.  She does not know who she is and what hospital she is in.  Patient is unsteady on her feet.   Skin: Skin is warm. Not diaphoretic. Skin is dry. No rash noted.   Psychiatric:   Patient denies suicidal thoughts.  When I asked why she took so many clonazepam yesterday she stated "suicide is very selfish I would never do that but I needed a break."     ED Course     Lab results:      CBC AND DIFFERENTIAL - Abnormal       Result Value    WBC 8.2      RBC 3.97 (*)     HGB 12.9      HCT 40.2      MCV 101 (*)     MCH 32.5      MCHC 32.1      Plt Ct 355      RDW SD 49.1 (*)     MPV 9.5      NRBC% 0.0      NRBC 0.000      NEUTROPHIL % 70.0      LYMPHOCYTE % 20.4 (*)     MONOCYTE % 8.5      Eosinophil % 0.2 (*)     BASOPHIL % 0.7      IG% 0.200      ABSOLUTE NEUTROPHIL COUNT 5.76  ABSOLUTE LYMPHOCYTE COUNT 1.7      MONO ABSOLUTE 0.7      EOS ABSOLUTE 0.0      BASO ABSOLUTE 0.1      IG ABSOLUTE 0.020     COMPREHENSIVE METABOLIC PANEL - Abnormal    Na 139      Potassium 3.7      Cl 102      CO2 24      Glucose 94      BUN 8      Creatinine 0.99      Ca 9.6      ALK  PHOS 72      T Bili 0.39      Total Protein 7.0      Alb 4.5      GLOBULIN 2.5      ALBUMIN/GLOBULIN RATIO 1.8      BUN/CREAT RATIO 8.1 (*)     ALT 9      AST 12      GFR AFRICAN AMERICAN 86      Comment: African-American:   Normal GFR (glomerular filtration rate) > 60 mL/min/1.73 meters squared. < 60 may include impaired kidney function based on creatinine, age, legal sex, and race normalized to accepted average body surface area     GFR Non African American 75      Comment: Non African American:   Normal GFR (glomerular filtration rate) > 60 mL/min/1.73 meters squared. < 60 may include impaired kidney function based on creatinine, age, legal sex, and race normalized to accepted average body surface area.     AGAP 13     SALICYLATE LEVEL - Abnormal    Salicylate <3.0 (*)    ACETAMINOPHEN LEVEL - Abnormal    Acetaminophen <5.0 (*)    URINE DRUGS OF ABUSE SCRN - Abnormal    Ur PH DOA Scr 6.0      Amphet Scr Positive (*)     Barb Scr Negative      Benzo Scr Positive (*)     Cannab Scr Positive (*)     Cocaine Scr Positive (*)     Opiates Scr Negative      Meth Scr Negative      Oxyco Scr Negative      Narrative:     Please Note Detection Levels Below:     Amphetamines                    1000 ng/mL    Barbiturates                    200 ng/mL    Benzodiazepines                 200 ng/mL    Cannabinoids (Marijuana, THC)   50 ng/mL    Cocaine                         300 ng/mL    Opiates                         300 ng/mL    Methadone                       300 ng/mL    Oxycodone                       100 ng/mL  This test is a screening test and results are only to be used for medical purposes.  If confirmation of positive results are needed, please order confirmation by GC/MS for each drug that needs confirmation.  Urine specimens are retained for 5 days.    UA NEGATIVE POPULATION, NEG SYMPTOMS - Abnormal    Urine Color Yellow      Urine Appearance Slt Cloudy (*)     Urine Specific Gravity 1.020      Urine pH 6.0       Urine Protein - Dipstick 20  (*)     Urine Glucose Negative      Urine Ketones 10  (*)     Urine Bilirubin Negative      Urine Blood 0.03 (*)     Urine Nitrite Negative      Urine Urobilinogen 2  (*)     Urine Leukocyte Esterase 250  (*)     Urine Squamous Epithelial Cells 20 (*)     Urine WBC 2      Urine RBC 2      Urine Bacteria Rare (*)     Urine Mucous Rare (*)    MAGNESIUM - Normal    Mg 1.9     ETHANOL - Normal    Ethanol <10      Comment: Blood Alcohol Level is for Medical Purposes Only.   POCT HCG    HCG,POC Negative      Lot Number [id number]      Expiration Date 12/27/2019      INTERNAL QC CHECK PERFORMED Acceptable       Imaging:    CT HEAD WO IV CONTRAST    Narrative:     HISTORY: Altered mental status.    COMPARISON: None.    FINDINGS:  CT dose reduction techniques were utilized for this exam. The ventricles and sulci are normal. There is no evidence of intracranial hemorrhage or extra-axial fluid collections. There is no mass or mass effect, and no midline shift. No infarcts   are identified.     The visualized portions of the paranasal sinuses, middle ears, and mastoid air cells are clear.      Impression:     IMPRESSION:   Normal noncontrast head CT.    Electronically Signed by: Gilberto Better, MD     ECG:  ECG Results       ECG 12 lead (In process)  Result time 01/20/19 15:17:26    In process            Narrative:    Diagnosis Class Abnormal  Acquisition Device [id number]  Ventricular Rate 86  Atrial Rate 86  P-R Interval 158  QRS Duration 84  Q-T Interval 390  QTC Calculation(Bazett) 466  Calculated P Axis 71  Calculated R Axis 70  Calculated T Axis -55    Diagnosis Normal sinus rhythm  T wave abnormality, consider inferior ischemia  Abnormal ECG  No previous ECGs available                   Pre-Sedation  Procedures      MDM  Number of Diagnoses or Management Options  Diagnosis management comments: MDM/Plan of Care: Patient presents for evaluation after using drugs yesterday.  She does appear to be  delayed in answering questions and has an unsteady gait.  I do think this is all likely due to her benzo use yesterday as well as the other drug she consumed.  Vital signs  are stable here.  CT of her head does not show any acute abnormalities.  Labs are reassuring.  She was slightly dehydrated she was given a liter of IV fluids.  Patient has been medically cleared at this time she remains to be stable.  I did discuss with psychiatry patient's need for evaluation      Amount and/or Complexity of Data Reviewed  Clinical lab tests: ordered and reviewed  Tests in the radiology section of CPT: ordered and reviewed  Discuss the patient with other providers: yes    Coding    Provider Communication    New Prescriptions    No medications on file     Modified Medications    No medications on file     Discontinued Medications    BUPROPION HCL (WELLBUTRIN PO)    Take by mouth.    CLONAZEPAM (KLONOPIN PO)    Take by mouth.     Clinical Impression     Final diagnoses:   Suicide attempt by drug ingestion, initial encounter (*)     ED Disposition     ED Disposition Comment    Behavioral Health                 Electronically signed by:    Suella Grove, NP  01/20/19 [id number]    Electronically signed by [place], MD at 01/22/2019  5:39 PM EDT    Associated attestation - [place], MD - 01/22/2019  5:39 PM EDT  Formatting of this note might be different from the original.  I have reviewed and agree with the APP's findings and plan for this patient.  [place], MD  Emergency Department - 01/22/2019 5:39 PM

## 2019-01-20 NOTE — Progress Notes (Signed)
Formatting of this note is different from the original.  [facility]    Behavioral Health Access Screening    Patient Name:  Wendy Martin  Date of Birth:  May 02, 1986   Today's Date:  January 20, 2019    Provisional Diagnosis   Provisional Diagnosis: Unspecified depression F33.2  Primary Presenting Problem: Mental Health  ASAM: 1. 3.0   2. 3.0   3. 3.0   4. 3.0  LOCUS Scores: 1. 4.0   2. 4.0   3. 4.0   4A. 4.0    4B. 4.0    5. 4.0   6. 3.0    Disposition   MD Contact Date: 01/20/19  MD Contact Time: 1829  Disposition Recommendation: Inpatient Admission(Needs DD bed)    Triage Screen   ED Triage Access Screening (Select All that are True): The patient is experiencing Suicidal/Homicidal ideations with an identifiable plan intent, means, or recent gesture/attempt.;There is current evidence of Substance Dependency with need for inpatient detoxification or patient has a history of detoxification seizures and/or delirium tremens.  Type of Screen: If NOT Face to Face, Skip to Disposition Section): Face to Face  Referral Source: Patient / Patient's fiancee  Referral Source Contact Number: Please see emergency contact  Release Signed: No  Referral Source Contacted: Yes  Release for Community Providers: No  Information Provided By:: Patient;Family  Court Appointed Guardian: No  Are you a Veteran?: Yes  Precipitating Factors: The patient is a 33 year old AA female who voluntarily presents to the ED. She seemed sedated and confused during the clinical assessment; she rambled and was unable to concentrate. Per patient, ?I don?t remember what I just said, that sucks.? 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  Date of last yearly physical:: Unknown  Outside help or community services at home: None  Is there anyone that you know, or are related to, on the Behavioral Health unit?: No    General  Information   Type of Screen: If NOT Face to Face, Skip to Disposition Section): Face to Face  Referral Source: Patient / Patient's fiancee  Referral Source Contact Number: Please see emergency contact  Release Signed: No  Referral Source Contacted: Yes  Release for Community Providers: No  Information Provided By:: Patient;Family  Court Appointed Guardian: No  Are you a Veteran?: Yes  Precipitating Factors: The patient is a 33 year old AA female who voluntarily presents to the ED. She seemed sedated and confused during the clinical assessment; she rambled and was unable to concentrate. Per patient, ?I don?t remember what I just said, that sucks.? 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  Date of last yearly physical:: Unknown  Outside help or community services at home: None  Is there anyone that you know, or are related to, on the Behavioral Health unit?: No    Potential Risk to Self   Suicidal threats/behaviors in past 6 months?: Yes  Suicidal Ideation or Suicide Threats: Yes  Recent attempt to Harm Self?: Yes  Intent for above: Yes  Currently engaging in self-injurious behavior?: Yes  History of Suicidal/Self-Injuring behaviors?: Yes  History of Suicidal/Self Injurious Behavior Last 6 months?: Yes  History of Suicidal/Self-Injuring behaviors  Greater than the past 6 months?: Yes  Access to firearms?: No  Other means of Harm?: No  Potential Risk to Others   Homicidal threats/behaviors in past 6 months?: No  Homicidal Ideation or Homicidal Threats?: No  Named Individual: No  Recent attempt to Harm Another?: No  Intent for above: No  Patient currently assaultive or combative?: No  History of Homicidal Acts/Assaultive behaviors?: No  History of Homicidal Acts/Assaultive behaviors within past 6 months?: No  History of Homicidal Acts/Assaultive behaviors   Greater than the past 6 months?: No  Access to  firearms?: No  Other means of Harm?: Yes  Patient able to reliably contract for safety?: Yes    Symptoms   Sleep pattern changed: Yes(Heavy drug use)  When did the Sleep Pattern Change?: UKN  Sleep Hours per Night: UKN  Sleeping increased: No  Sleeping decreased: Yes  Sleep Decrease Details: Other (comment)(Drug use)  Problems: No    Appetite change: Yes  Weight change: Yes  Amount of weight change: 10 pounds  Time Frame of Weight Change: UKN  Gain or Loss: Loss  Appetite Problems:: No    Hopelessness/Helplessness: Yes  Crying spells/mood swings: Yes  Low energy/fatigue: Yes  Concentration problems: No  Psychomotor retardation/agitation: No  Feelings of guilt/worthlessness: Yes  Social withdrawal: Yes  Recurrent thoughts of death: Yes  Deterioration in Activities of Daily Living: Yes    Rapid pressured speech: No  Increase in impulsivity: Yes  Increase in energy: No  Flight of ideas/loose association: No    Excessive worry: Yes  Nervousness: No  Irritability: Yes  Shortness of breath: No  Racing heart rate: No  Sweaty/Chills/Hot flashes: No  Nausea/Vomiting/Diarrhea: No  Chest Pain: No    Additional Symptom Information: The patient is a 33 year old AA female who voluntarily presents to the ED. She seemed sedated and confused during the clinical assessment; she rambled and was unable to concentrate. Per patient, ?I don?t remember what I just said, that sucks.? 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 On 01/19/2019, her fiance returned from work and found the patient asleep and sweating profusely. He believes that 18 pills are missing from her current prescriptions but is uncertain about which type of medication she ingested and described this as an intentional suicide attempt. The patient?s drug screen results indicate benzodiazepines, cocaine, methamphetamine, and THC use. She was evasive when asked about  substance use, and said, ?I smoke a lot, like 3 or 4 blunts a day.? The patient acknowledged using cocaine on a sporadic basis, and said, ?I only do that like twice a month, when I get paid. It?s expensive.? She lives with her fiance and is employed by the [employer].    Psychosis   Delusions: No Delusions  Hallucinations: None  Ambivalence: No (Comment)  Confusion: No (Comment)  Disorganization: No (Comment)    Treatments   Treatments?: Yes  Treatment Provider/Location: Patient is often treated at the [place]. Please see Care Everywhere for a complete list.  Treatment Type: Behavioral Health;Inpatient;Outpatient  Treatment Date of Next Appt or Last Appt: UKN  Additional Treatment?: No  Did you follow up with your aftercare appointment?: N/A  Did you take your medication as prescribed?: N/A    Substance Use   Substance use in past 12 months?: Yes  Drug Screen: Positive  History of Substance Use/Abuse:: Yes  Marijuana Use?: Patient Reports;Drug Screen Positive  Marijuana -  Amount/Frequency: She was evasive when asked about substance use, and said, ?I smoke a lot, like 3 or  4 blunts a day.?  Marijuana - How is it being acquired?: Buying off the street  Cocaine/Crack Use?: Drug Screen Positive;Patient Reports  Cocaine/Crack - Amount/Frequency: The patient acknowledged using cocaine on a sporadic basis, and said, ?I only do that like twice a month, when I get paid. It?s expensive.?  Cocaine/Crack - How is it being acquired?: Buying off the street  Amphetamine Use?: Drug Screen Positive  Tobacco/Nicotine Use?: No  Other Drug Use?: Drug Screen Positive(The patient?s drug screen results indicate benzodiazepines, cocaine, methamphetamine, and THC use.)  Withdrawal Potential: Not Applicable  Problems Related to Substance Use/Abuse: Not Applicable    Alcohol Abuse   Alcohol abuse in past 12 months?: No  History of Alcohol Use/Abuse:: Patient Denies any history or Current Use      Functioning   Dressing: Independent  Bathing:  Independent  Toileting: Independent  Feeding: Independent  Hearing - Right Ear: Functional  Hearing - Left Ear: Functional  Vision - Right Eye: Functional  Vision - Left  Eye: Functional  Walks in Home: Independent  Patient Fall Risk Level: Low/medium  Possible barriers to participate in Treatment/Programming?: No  Current living arrangements (who lives with): The patient lives with her fiancee.  Able to return to Current Living Arrangements?: Yes  Support System:: Her finacess is supportive.  Healthy coping skills: Otho Bellows)  Recreational/Leisure activities: Otho Bellows  Religious/Spiritual orientation: Otho Bellows  Cultural Preferences: Otho Bellows    Strengths/Limitations         BH History       Therapist, art: No  Engineer, drilling?: No    Child/Adolescent Assessment   Child / Adolescent?: No    Mental Status   General Appearance: Equal to stated age  Motor Activity: Decreased  Speech: Minimal  Exhibited Behavior: Sedated  Affect Range /Display: Blunted  Mood Range /Display: Blunted  Affect/Mood Display: Unable to assess  Mood: Other (Comment)(Sleepy)  Thought Process: WDL  Thought Content: WDL  Insight: Limited  Orientation To:: Person (Yes);Place (Yes);Situation (Yes);Date (Yes)      Electronically signed:  Melony Overly, [facility]  01/20/2019 / 6:30 PM    Electronically signed by Melony Overly, [facility] at 01/20/2019  6:30 PM EDT

## 2019-01-20 NOTE — ED Notes (Signed)
Formatting of this note might be different from the original.  Pt moved to [facility]. Ambulatory to BR to void. Denies needs at this time.   Electronically signed by Denny Levy, RN at 01/20/2019 10:11 PM EDT

## 2019-01-20 NOTE — Consults (Signed)
Formatting of this note is different from the original.  [facility] Psychiatry - ED Consult    Date of Service: 01/20/2019  Referral Source: Emergency Department  Record Review: extensive  Assessment     Psychiatric Diagnoses:  Active Problems:    Suicide attempt by drug ingestion (*)    Severe episode of recurrent major depressive disorder, without psychotic features (*)    Medical Diagnoses:  Active Ambulatory Problems     Diagnosis Date Noted   ? No Active Ambulatory Problems     Resolved Ambulatory Problems     Diagnosis Date Noted   ? No Resolved Ambulatory Problems     Past Medical History:   Diagnosis Date   ? Depression    ? PTSD (post-traumatic stress disorder)          Formulation and MDM: Wendy Martin is a 33 y.o. female with a history of depression, PTSD who presented to the ED for suicide attempt by drug ingestion.  Patient was brought in to the ED taking approximately 15 Klonopin, drinking alcohol, smoked weed and cocaine.  While in the ED patient is unsteady on her feet and appeared to be drowsy.  Patient initially admitted to suicide attempt by drug ingestion that did not work and wanted to escape her stressors but at times she would denies suicide attempt.  Patient presentation suggests likely overdose will seek inpatient treatment.    The patient has been evaluated and determined to be medically stable by the ED provider.  Patient has been assessed by the ED [facility] Therapist and the findings have been discussed with this provider.  Psychiatry was consulted to assist with psychiatric assessment and treatment/disposition planning.  The chart has been reviewed and pertinent findings are noted below. Based on this review and assessment, the treatment plan has been created and discussed with the treatment team.     Based on my assessment, patient requires psychiatric hospitalization due to risk of self injury and risk of injury to others.    Safety  Assessment:  Individualized risk factors include: actively suicidal with plan, hopelessness, impulsiveness and substance abuse.  Individualized protective factors include: patient has treatable psychiatric disorders and symptoms.  Taking the aforementioned non-modifiable and modifiable risk factors in conjunction with her protective factors, the patient is currently felt to be of high imminent risk of harm to self.  To further mitigate risk, please see the below treatment recommendations.    Treatment Plan & Recommendation     - Disposition:   - Seek Inpatient Psychiatric Hospitalization  - Commitment Status: Voluntary IVC if wanting to leave  - Precautions:   - suicide and fall  - Pertinent Labs:   -BUN/creatinine ratio 8.1 CMP otherwise unremarkable              -UDS positive with amphetamine, benzodiazepine, cocaine, cannabis              -EKG QT/QTc 390/466 normal sinus rhythm  - Psych Med Recs:   -Hold medication until tomorrow Wellbutrin SR 200 mg 2 times a day, Klonopin 0.5 mg twice daily  - Medical Recs:   -ED MD to review    Chief Complaint     Suicide attempt by drug ingestion  Depressed mood  Multiple stressors    History of Present Illness     Wendy Martin is a 33 y.o. female with a history of depression, PTSD who presented to the ED for suicide attempt by  drug ingestion.  Patient was brought in to the ED taking approximately 15 Klonopin, drinking alcohol, smoked weed and cocaine.  While in the ED patient is unsteady on her feet and appeared to be drowsy.  Patient initially admitted to suicide attempt by drug ingestion that did not work and wanted to escape her stressors but at times she would denies suicide attempt.  Patient report her stressors includes 1 of her sons teacher Mrs. Katrinka Blazing contacted CPS to investigate her due to negligent, job stress that she is always late to work, and relationship difficulty with her boyfriend.  Patient report in February her son's teacher Mrs. Smith notified CPS  due to negligence.  Patient report she had resentment toward Mrs. Katrinka Blazing because she is being a "Clydie Braun" and that if they ever met on the street she will confront her although admitted " not going out of her way to look for her".  Patient also report having resentment because DSS contacted her "ex" notifying him of the investigation.  Patient felt she rates her cycle 11 and half year by herself and her ex not providing child support was the one that is neglectful and not needing to be contacted.  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  Patient denies previous suicide attempt but has one recent psychiatric admission for 2 days.  Patient report she is followed by Dr. Marella Chimes in [place] and currently on Wellbutrin and Klonopin.    Currently on interview, the patient is cooperative and appeared to be drowsy.  Patient is somewhat of a poor historian during the interview.  Her thought content is hopelessness.  Patient is mostly tearful in the evaluation.  Patient speaks softly with brief interruption and is easily distracted.  Patient report depressed mood and her affect is congruent.  Patient denies current SI, HI, and AVH.  Patient UDS is positive with amphetamine, benzodiazepine, cocaine, and cannabis.  Patient reported using cannabis daily about 4 blunts and denies daily cocaine use except when she gets paid.  Patient also admitted to heavy alcohol use in the past.    Current suicidal/homicidal ideations: Admit to suicide attempt and passive HI  Current auditory/visual hallucinations: Denies    Review Of Systems:  A complete review of systems of the following systems was conducted (Constitutional, Psychiatric, Neurological, Musculoskeletal, Eyes, Gastrointestinal, Cardiovascular, Respiratory, Skin, and Endocrine). All reviewed systems are negative except pertinent positives identified in the HPI.    Past Psychiatric History     Previous diagnoses: Depression, PTSD  Previous psychiatric medication  trials: Wellbutrin, Klonopin  Past suicidal/homicidal ideation/attempt: Report " first attempt"  Current/Past psychiatric provider: At the [facility]  Previous psychiatric hospitalizations/Rehab: [facility]    Past Medical History     Past Medical History:   Diagnosis Date   ? Depression    ? PTSD (post-traumatic stress disorder)      Substance Use History     Marijuana: Daily use  Cocaine: Occasional use  Opiates: Denies  Stimulants: Denies use UDS positive  Benzodiazepine: Has a prescription  Tobacco:  1 PPD  Alcohol: Heavy drinking in the past  Other illicit drug usage: Denies    Patient denies all other substance use except for what is listed above.    Readiness for substance/alcohol abuse treatment, if applicable: No    Family History     Family history of suicide? No    History reviewed. No pertinent family history.    Social  History     Lives with her son and her boyfriend  Report college level education  Trauma history UTA DX with PTSD    Access to firearms: no    Social History     Socioeconomic History   ? Marital status: Single     Spouse name: Not on file   ? Number of children: Not on file   ? Years of education: Not on file   ? Highest education level: Not on file   Occupational History   ? Not on file   Social Needs   ? Financial resource strain: Not on file   ? Food insecurity     Worry: Not on file     Inability: Not on file   ? Transportation needs     Medical: Not on file     Non-medical: Not on file   Tobacco Use   ? Smoking status: Current Every Day Smoker   ? Smokeless tobacco: Never Used   Substance and Sexual Activity   ? Alcohol use: Yes   ? Drug use: Yes     Types: Cocaine   ? Sexual activity: Not on file   Lifestyle   ? Physical activity     Days per week: Not on file     Minutes per session: Not on file   ? Stress: Not on file   Relationships   ? Social Wellsite geologist on phone: Not on file     Gets together: Not on file     Attends religious service: Not on  file     Active member of club or organization: Not on file     Attends meetings of clubs or organizations: Not on file     Relationship status: Not on file   ? Intimate partner violence     Fear of current or ex partner: Not on file     Emotionally abused: Not on file     Physically abused: Not on file     Forced sexual activity: Not on file   Other Topics Concern   ? Not on file   Social History Narrative   ? Not on file     Evaluation     Vitals:   Vitals:    01/20/19 1616   BP: 130/77   Pulse: 97   Resp: 18   Temp:    SpO2: 100%     Medications:    acetaminophen, ondansetron **OR** ondansetron  Allergies:  Allergies   Allergen Reactions   ? Primaquine Anaphylaxis     Labs:  Results for orders placed or performed during the hospital encounter of 01/20/19 (from the past 24 hour(s))   POCT HCG    Collection Time: 01/20/19  2:26 PM   Result Value Ref Range    HCG,POC Negative Negative    Lot Number [id number]     Expiration Date 12/27/2019     INTERNAL QC CHECK PERFORMED Acceptable    UR Drugs of Abuse Screen    Collection Time: 01/20/19  2:26 PM   Result Value Ref Range    Ur PH DOA Scr 6.0 4.5 - 9.0    Amphet Scr Positive (A) Negative    Barb Scr Negative Negative    Benzo Scr Positive (A) Negative    Cannab Scr Positive (A) Negative    Cocaine Scr Positive (A) Negative    Opiates Scr Negative Negative    Meth Scr Negative Negative  Oxyco Scr Negative Negative   Urinalysis with Possible Microscopy    Collection Time: 01/20/19  2:26 PM   Result Value Ref Range    Urine Color Yellow Yellow     Urine Appearance Slt Cloudy (A) Clear    Urine Specific Gravity 1.020 1.005 - 1.030    Urine pH 6.0 5 to 9    Urine Protein - Dipstick 20  (A) Negative mg/dl    Urine Glucose Negative Negative mg/dL    Urine Ketones 10  (A) Negative mg/dl    Urine Bilirubin Negative Negative mg/dL    Urine Blood 6.76 (A) Negative mg/dL    Urine Nitrite Negative Negative    Urine Urobilinogen 2  (A) <2 mg/dl    Urine Leukocyte Esterase 250  (A)  Negative Leu/mcL    Urine Squamous Epithelial Cells 20 (H) <=2 /HPF    Urine WBC 2 <=2 /HPF    Urine RBC 2 <=2 /HPF    Urine Bacteria Rare (A) None Seen /HPF    Urine Mucous Rare (A) None /HPF   CBC And Differential    Collection Time: 01/20/19  2:32 PM   Result Value Ref Range    WBC 8.2 3.7 - 11.0 thou/mcL    RBC 3.97 (L) 4.01 - 4.90 million/mcL    HGB 12.9 12.2 - 14.9 gm/dL    HCT 19.5 09.3 - 26.7 %    MCV 101 (H) 82 - 98 fL    MCH 32.5 27.0 - 33.0 pg    MCHC 32.1 31.0 - 37.0 gm/dL    Plt Ct 124 580 - 998 thou/mcL    RDW SD 49.1 (H) 36.0 - 47.0 fL    MPV 9.5 8.9 - 11.2 fL    NRBC% 0.0 0 /100WBC    NRBC 0.000 0 thou/mcL    NEUTROPHIL % 70.0 50.0 - 70.0 %    LYMPHOCYTE % 20.4 (L) 25.0 - 40.0 %    MONOCYTE % 8.5 4.0 - 12.0 %    Eosinophil % 0.2 (L) 1.0 - 6.0 %    BASOPHIL % 0.7 0.0 - 2.0 %    IG% 0.200 0.001 - 0.429 %    ABSOLUTE NEUTROPHIL COUNT 5.76 1.50 - 7.50 thou/mcL    ABSOLUTE LYMPHOCYTE COUNT 1.7 1.0 - 4.5 thou/mcL    MONO ABSOLUTE 0.7 0.1 - 0.8 thou/mcL    EOS ABSOLUTE 0.0 0.0 - 0.5 thou/mcL    BASO ABSOLUTE 0.1 0.0 - 0.2 thou/mcL    IG ABSOLUTE 0.020 0.001 - 0.031 thou/mcL    Comprehensive metabolic panel    Collection Time: 01/20/19  2:32 PM   Result Value Ref Range    Na 139 136 - 146 mmol/L    Potassium 3.7 3.7 - 5.4 mmol/L    Cl 102 97 - 108 mmol/L    CO2 24 20 - 32 mmol/L    Glucose 94 65 - 99 mg/dL    BUN 8 6 - 20 mg/dL    Creatinine 3.38 2.50 - 1.00 mg/dL    Ca 9.6 8.7 - 53.9 mg/dL    ALK PHOS 72 25 - 767 IU/L    T Bili 0.39 0.00 - 1.20 mg/dL    Total Protein 7.0 6.0 - 8.5 gm/dL    Alb 4.5 3.5 - 5.5 gm/dL    GLOBULIN 2.5 1.5 - 4.5 gm/dL    ALBUMIN/GLOBULIN RATIO 1.8 1.1 - 2.5    BUN/CREAT RATIO 8.1 (L) 11.0 - 26.0    ALT 9  0 - 40 IU/L    AST 12 0 - 40 IU/L    GFR AFRICAN AMERICAN 86 mL/min/1.79m2    GFR Non African American 75 mL/min/1.93m2    AGAP 13 7 - 16 mmol/L   Magnesium    Collection Time: 01/20/19  2:32 PM   Result Value Ref Range    Mg 1.9 1.6 - 2.6 mg/dL   Ethanol    Collection Time:  01/20/19  2:32 PM   Result Value Ref Range    Ethanol <10 0 mg/dL   Salicylate Level    Collection Time: 01/20/19  2:32 PM   Result Value Ref Range    Salicylate <3.0 (L) 30.0 - 250.0 mcg/mL   Acetaminophen Level    Collection Time: 01/20/19  2:32 PM   Result Value Ref Range    Acetaminophen <5.0 (L) 10.0 - 25.0 mcg/mL     Mental Status Evaluation     Constitutional:    General Appearance Wearing hospital scrubs and normal appearance    General Behavior Pleasant and cooperative and Tearful   Musculoskeletal:    Gait and Station In bed for duration of assessment    Strength and tone Normal   Psychiatric:    Psychomotor Activity No motor abnormalities   Speech  Soft and Latency   Mood  Dysphoric and Depressed   Affect  Depressed   Thought Process Derailed and Disorganized    Associations Intact association   Thought Content/Perceptual Disturbances Evidence of:  Suicidal ideation   Cognition/Sensorium  Attention: Not intact at time   Insight  Poor   Judgment Poor     Electronically signed by:  Riley Churches, NP  01/20/2019 6:54 PM      Electronically signed by Joya San, MD at 01/21/2019  8:35 AM EDT

## 2019-01-20 NOTE — Progress Notes (Signed)
Formatting of this note is different from the original.  [facility]        [facility].  [facility]ed [facility] documents.    Transfer documents will be ready to fax following a physician's signature.    Electronically signed:  [place], MSW, LCSW  01/20/2019 / 7:53 PM      Electronically signed by [place], MSW at 01/20/2019  7:56 PM EDT

## 2019-01-20 NOTE — ED Notes (Signed)
Formatting of this note might be different from the original.  RN spoke with pt's family member Gerre Pebbles, with patient's permission, and provided updates  Electronically signed by Deneen Harts, RN at 01/20/2019  6:10 PM EDT

## 2019-01-20 NOTE — Progress Notes (Signed)
Formatting of this note might be different from the original.  Patient was with nursing and safety attendant. Will re-attempt clinical assessment at later time.   Electronically signed by Melony Overly, [facility] at 01/20/2019  4:20 PM EDT

## 2019-01-21 NOTE — Progress Notes (Signed)
Formatting of this note is different from the original.  [employer] [facility]        This Clinical research associate received phone call from charge that there is no custody order for pt and [facility] is here to get pt. This Clinical research associate called [facility] and asked to please go to courthouse and get custody order.    This Clinical research associate called courthouse to inform them that Public safety would come get custody order. This Clinical research associate was informed that IVC was not received. This Clinical research associate confirmed fax time and reported that fax confirmation page shows it went through. Magistrate confirmed no record of pt in log or proof of fax. Magistrate reported that if  IVC is faxed now, it will not take long to print up custody order.    This Patent attorney and asked her to please refax the IVC paperwork. This Clinical research associate was advised that [facility] will be leaving and transportation will need to be set up for tomorrow. Charge nurse will refax IVC paperwork now so custody order will be ready for pt to be transported tomorrow.     Electronically signed:  Agustina Caroli, [facility]  01/21/2019 / 8:52 PM      Electronically signed by Agustina Caroli, [facility] at 01/21/2019  9:04 PM EDT

## 2019-01-21 NOTE — Progress Notes (Signed)
Formatting of this note is different from the original.     01/21/19 1751   Disposition   MD Contact Name Dr Johney Frame   MD Contact Date 01/21/19   MD Contact Time 1751   Disposition Recommendation 2  ([facility])   Admission Type Placed on Involuntary Commitment by NH   Psychiatrist Name Dr Wardell Heath   Room # [facility] hospital   Facility Name: [facility] hospital   Disposition Comments Please call report [phone] ext [phone]     Electronically signed by Melony Overly, [facility] at 01/21/2019  5:52 PM EDT

## 2019-01-21 NOTE — Progress Notes (Signed)
Formatting of this note might be different from the original.  [facility].   Electronically signed by Melony Overly, [facility] at 01/21/2019  9:21 AM EDT

## 2019-01-21 NOTE — ED Notes (Signed)
Formatting of this note might be different from the original.  IVC paperwork faxed to magistrate. Left VM to confirm they received paperwork. Awaiting call from magistrate.  Electronically signed by Caroll Rancher, RN, BSN at 01/21/2019  2:00 PM EDT

## 2019-01-21 NOTE — Consults (Signed)
Associated Order(s): BH ACCESS CONSULT; IP CONSULT TO PSYCHIATRY / BH  Formatting of this note is different from the original.  [facility]  [facility] Psychiatry - ED Consult  Note includes prior assessment by Riley Churches, NP with modifications    Date of Service: 01/21/2019  Referral Source: Emergency Department  Record Review: extensive     Assessment     Psychiatric Diagnoses:  Active Problems:    Suicide attempt by drug ingestion (*)    Severe episode of recurrent major depressive disorder, without psychotic features (*)    Medical Diagnoses:  Active Ambulatory Problems     Diagnosis Date Noted   ? No Active Ambulatory Problems     Resolved Ambulatory Problems     Diagnosis Date Noted   ? No Resolved Ambulatory Problems     Past Medical History:   Diagnosis Date   ? Depression    ? PTSD (post-traumatic stress disorder)          Formulation and MDM: Wendy Martin is a 33 y.o. female with a history of depression, PTSD who presented to the ED for suicide attempt by drug ingestion.  Patient was brought in to the ED taking approximately 15 Klonopin, drinking alcohol, smoked weed and cocaine.  While in the ED patient is unsteady on her feet and appeared to be drowsy.  Patient initially admitted to suicide attempt by drug ingestion that did not work and wanted to escape her stressors but at times she would denies suicide attempt.  Patient presentation suggests likely overdose will seek inpatient treatment.    01/21/2019    Admits to suicide attempt   Would benefit from inpatient psychiatric stabilization     The patient has been evaluated and determined to be medically stable by the ED provider.  Patient has been assessed by the ED [facility] Therapist and the findings have been discussed with this provider.  Psychiatry was consulted to assist with psychiatric assessment and treatment/disposition planning.  The chart has been reviewed and pertinent findings are noted below. Based on this  review and assessment, the treatment plan has been created and discussed with the treatment team.     Based on my assessment, patient requires psychiatric hospitalization due to risk of self injury and risk of injury to others.    Safety Assessment:  Individualized risk factors include: actively suicidal with plan, hopelessness, impulsiveness and substance abuse.  Individualized protective factors include: patient has treatable psychiatric disorders and symptoms.  Taking the aforementioned non-modifiable and modifiable risk factors in conjunction with her protective factors, the patient is currently felt to be of high imminent risk of harm to self.  To further mitigate risk, please see the below treatment recommendations.    Treatment Plan & Recommendation     - Disposition:   - Seek Inpatient Psychiatric Hospitalization  - Commitment Status: Voluntary IVC if wanting to leave  - Precautions:   - suicide and fall  - Pertinent Labs:   -BUN/creatinine ratio 8.1 CMP otherwise unremarkable              -UDS positive with amphetamine, benzodiazepine, cocaine, cannabis              -EKG QT/QTc 390/466 normal sinus rhythm  - Psych Med Recs:   -Hold medication until tomorrow Wellbutrin SR 200 mg 2 times a day, Klonopin 0.5 mg twice daily  - Medical Recs:   -ED MD to review    Chief Complaint  Suicide attempt by drug ingestion  Depressed mood  Multiple stressors    History of Present Illness     Wendy Martin is a 33 y.o. female with a history of depression, PTSD who presented to the ED for suicide attempt by drug ingestion.  Patient was brought in to the ED taking approximately 15 Klonopin, drinking alcohol, smoked weed and cocaine.  While in the ED patient is unsteady on her feet and appeared to be drowsy.  Patient initially admitted to suicide attempt by drug ingestion that did not work and wanted to escape her stressors but at times she would denies suicide attempt.  Patient report her stressors includes 1 of her  sons teacher Mrs. Katrinka Blazing contacted CPS to investigate her due to negligent, job stress that she is always late to work, and relationship difficulty with her boyfriend.  Patient report in February her son's teacher Mrs. Smith notified CPS due to negligence.  Patient report she had resentment toward Mrs. Katrinka Blazing because she is being a "Clydie Braun" and that if they ever met on the street she will confront her although admitted " not going out of her way to look for her".  Patient also report having resentment because DSS contacted her "ex" notifying him of the investigation.  Patient felt she rates her cycle 11 and half year by herself and her ex not providing child support was the one that is neglectful and not needing to be contacted.  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.  Patient denies previous suicide attempt but has one recent psychiatric admission for 2 days.  Patient report she is followed by Dr. Marella Chimes in [place] and currently on Wellbutrin and Klonopin.    Currently on interview, the patient is cooperative and appeared to be drowsy.  Patient is somewhat of a poor historian during the interview.  Her thought content is hopelessness.  Patient is mostly tearful in the evaluation.  Patient speaks softly with brief interruption and is easily distracted.  Patient report depressed mood and her affect is congruent.  Patient denies current SI, HI, and AVH.  Patient UDS is positive with amphetamine, benzodiazepine, cocaine, and cannabis.  Patient reported using cannabis daily about 4 blunts and denies daily cocaine use except when she gets paid.  Patient also admitted to heavy alcohol use in the past. Described how she was recently admitted to psychiatric unit at the [facility] system because "I was homicidal towards my son's teacher, in a way I still am, but just need to let it go."     Current suicidal/homicidal ideations: Admit to suicide attempt and passive HI  Current auditory/visual hallucinations:  Denies     01/21/2019 @ 10 am    Patient seen at bedside for follow up - patient arrived overnight with report of suicide attempt via overdose. UTOX - positive - admits to polysubstance use.  At bedside patient is curled up, appears depressed, tearful, endorsed sense of hopelessness "I've been through this, done treatment, nothing helps." Admits to having taken an overdose of "too many pills, I can't even remember well last night." Reports she feels she "don't want to be here, there's nothing to hold me down." follows this by talking about her son and starts sobbing; describing how she feels unable to care for him.     Review Of Systems:  A complete review of systems of the following systems was conducted (Constitutional, Psychiatric, Neurological, Musculoskeletal, Eyes, Gastrointestinal, Cardiovascular, Respiratory, Skin, and Endocrine). All  reviewed systems are negative except pertinent positives identified in the HPI.    Past Psychiatric History     Previous diagnoses: Depression, PTSD  Previous psychiatric medication trials: Wellbutrin, Klonopin  Past suicidal/homicidal ideation/attempt: Report " first attempt"  Current/Past psychiatric provider: At the [facility]  Previous psychiatric hospitalizations/Rehab[facility]    Past Medical History     Past Medical History:   Diagnosis Date   ? Depression    ? PTSD (post-traumatic stress disorder)      Substance Use History     Marijuana: Daily use  Cocaine: Occasional use  Opiates: Denies  Stimulants: Denies use UDS positive  Benzodiazepine: Has a prescription  Tobacco:  1 PPD  Alcohol: Heavy drinking in the past  Other illicit drug usage: Denies    Patient denies all other substance use except for what is listed above.    Readiness for substance/alcohol abuse treatment, if applicable: No    Family History     Family history of suicide? No    History reviewed. No pertinent family history.    Social History     Lives with her son and her  boyfriend  Report college level education  Trauma history UTA DX with PTSD    Access to firearms: no    Social History     Socioeconomic History   ? Marital status: Single     Spouse name: Not on file   ? Number of children: Not on file   ? Years of education: Not on file   ? Highest education level: Not on file   Occupational History   ? Not on file   Social Needs   ? Financial resource strain: Not on file   ? Food insecurity     Worry: Not on file     Inability: Not on file   ? Transportation needs     Medical: Not on file     Non-medical: Not on file   Tobacco Use   ? Smoking status: Current Every Day Smoker   ? Smokeless tobacco: Never Used   Substance and Sexual Activity   ? Alcohol use: Yes   ? Drug use: Yes     Types: Cocaine   ? Sexual activity: Not on file   Lifestyle   ? Physical activity     Days per week: Not on file     Minutes per session: Not on file   ? Stress: Not on file   Relationships   ? Social Wellsite geologist on phone: Not on file     Gets together: Not on file     Attends religious service: Not on file     Active member of club or organization: Not on file     Attends meetings of clubs or organizations: Not on file     Relationship status: Not on file   ? Intimate partner violence     Fear of current or ex partner: Not on file     Emotionally abused: Not on file     Physically abused: Not on file     Forced sexual activity: Not on file   Other Topics Concern   ? Not on file   Social History Narrative   ? Not on file     Evaluation     Vitals:   Vitals:    01/21/19 0558   BP: 112/74   Pulse: 84   Resp: 18  Temp: 98.1 F (36.7 C)   SpO2: 100%     Medications:  ? buPROPion  200 mg Oral BID     acetaminophen, clonazePAM, ondansetron **OR** ondansetron  Allergies:  Allergies   Allergen Reactions   ? Primaquine Anaphylaxis     Labs:  Results for orders placed or performed during the hospital encounter of 01/20/19 (from the past 24 hour(s))   POCT HCG    Collection Time: 01/20/19  2:26 PM    Result Value Ref Range    HCG,POC Negative Negative    Lot Number [id number]     Expiration Date 12/27/2019     INTERNAL QC CHECK PERFORMED Acceptable    UR Drugs of Abuse Screen    Collection Time: 01/20/19  2:26 PM   Result Value Ref Range    Ur PH DOA Scr 6.0 4.5 - 9.0    Amphet Scr Positive (A) Negative    Barb Scr Negative Negative    Benzo Scr Positive (A) Negative    Cannab Scr Positive (A) Negative    Cocaine Scr Positive (A) Negative    Opiates Scr Negative Negative    Meth Scr Negative Negative    Oxyco Scr Negative Negative   Urinalysis with Possible Microscopy    Collection Time: 01/20/19  2:26 PM   Result Value Ref Range    Urine Color Yellow Yellow     Urine Appearance Slt Cloudy (A) Clear    Urine Specific Gravity 1.020 1.005 - 1.030    Urine pH 6.0 5 to 9    Urine Protein - Dipstick 20  (A) Negative mg/dl    Urine Glucose Negative Negative mg/dL    Urine Ketones 10  (A) Negative mg/dl    Urine Bilirubin Negative Negative mg/dL    Urine Blood 0.98 (A) Negative mg/dL    Urine Nitrite Negative Negative    Urine Urobilinogen 2  (A) <2 mg/dl    Urine Leukocyte Esterase 250  (A) Negative Leu/mcL    Urine Squamous Epithelial Cells 20 (H) <=2 /HPF    Urine WBC 2 <=2 /HPF    Urine RBC 2 <=2 /HPF    Urine Bacteria Rare (A) None Seen /HPF    Urine Mucous Rare (A) None /HPF   CBC And Differential    Collection Time: 01/20/19  2:32 PM   Result Value Ref Range    WBC 8.2 3.7 - 11.0 thou/mcL    RBC 3.97 (L) 4.01 - 4.90 million/mcL    HGB 12.9 12.2 - 14.9 gm/dL    HCT 11.9 14.7 - 82.9 %    MCV 101 (H) 82 - 98 fL    MCH 32.5 27.0 - 33.0 pg    MCHC 32.1 31.0 - 37.0 gm/dL    Plt Ct 562 130 - 865 thou/mcL    RDW SD 49.1 (H) 36.0 - 47.0 fL    MPV 9.5 8.9 - 11.2 fL    NRBC% 0.0 0 /100WBC    NRBC 0.000 0 thou/mcL    NEUTROPHIL % 70.0 50.0 - 70.0 %    LYMPHOCYTE % 20.4 (L) 25.0 - 40.0 %    MONOCYTE % 8.5 4.0 - 12.0 %    Eosinophil % 0.2 (L) 1.0 - 6.0 %    BASOPHIL % 0.7 0.0 - 2.0 %    IG% 0.200 0.001 - 0.429 %    ABSOLUTE  NEUTROPHIL COUNT 5.76 1.50 - 7.50 thou/mcL    ABSOLUTE LYMPHOCYTE COUNT 1.7 1.0 - 4.5 thou/mcL  MONO ABSOLUTE 0.7 0.1 - 0.8 thou/mcL    EOS ABSOLUTE 0.0 0.0 - 0.5 thou/mcL    BASO ABSOLUTE 0.1 0.0 - 0.2 thou/mcL    IG ABSOLUTE 0.020 0.001 - 0.031 thou/mcL    Comprehensive metabolic panel    Collection Time: 01/20/19  2:32 PM   Result Value Ref Range    Na 139 136 - 146 mmol/L    Potassium 3.7 3.7 - 5.4 mmol/L    Cl 102 97 - 108 mmol/L    CO2 24 20 - 32 mmol/L    Glucose 94 65 - 99 mg/dL    BUN 8 6 - 20 mg/dL    Creatinine 6.29 5.28 - 1.00 mg/dL    Ca 9.6 8.7 - 41.3 mg/dL    ALK PHOS 72 25 - 244 IU/L    T Bili 0.39 0.00 - 1.20 mg/dL    Total Protein 7.0 6.0 - 8.5 gm/dL    Alb 4.5 3.5 - 5.5 gm/dL    GLOBULIN 2.5 1.5 - 4.5 gm/dL    ALBUMIN/GLOBULIN RATIO 1.8 1.1 - 2.5    BUN/CREAT RATIO 8.1 (L) 11.0 - 26.0    ALT 9 0 - 40 IU/L    AST 12 0 - 40 IU/L    GFR AFRICAN AMERICAN 86 mL/min/1.83m2    GFR Non African American 75 mL/min/1.56m2    AGAP 13 7 - 16 mmol/L   Magnesium    Collection Time: 01/20/19  2:32 PM   Result Value Ref Range    Mg 1.9 1.6 - 2.6 mg/dL   Ethanol    Collection Time: 01/20/19  2:32 PM   Result Value Ref Range    Ethanol <10 0 mg/dL   Salicylate Level    Collection Time: 01/20/19  2:32 PM   Result Value Ref Range    Salicylate <3.0 (L) 30.0 - 250.0 mcg/mL   Acetaminophen Level    Collection Time: 01/20/19  2:32 PM   Result Value Ref Range    Acetaminophen <5.0 (L) 10.0 - 25.0 mcg/mL     Mental Status Evaluation     Constitutional:    General Appearance Wearing hospital scrubs and normal appearance    General Behavior Pleasant and cooperative and Tearful   Musculoskeletal:    Gait and Station In bed for duration of assessment    Strength and tone Normal   Psychiatric:    Psychomotor Activity No motor abnormalities   Speech  Soft and Latency   Mood  Dysphoric and Depressed   Affect  Depressed   Thought Process Linear, logical, and goal directed   Associations Intact association   Thought  Content/Perceptual Disturbances Evidence of:  Suicidal ideation   Cognition/Sensorium  Attention: Not intact at time   Insight  Poor   Judgment Poor     Electronically signed by:  Zigmund Gottron, MD  01/21/2019 11:55 AM      Electronically signed by Joya San, MD at 01/21/2019 12:03 PM EDT

## 2019-01-21 NOTE — Progress Notes (Signed)
Formatting of this note might be different from the original.  [place] is requested a COVID test. Called [facility] and asked for this to be ordered.   Electronically signed by Melony Overly, [facility] at 01/21/2019 10:29 AM EDT

## 2019-01-21 NOTE — Progress Notes (Signed)
Formatting of this note might be different from the original.  Patient COVID test faxed to [place].  Electronically signed by Melony Overly, [facility] at 01/21/2019  2:04 PM EDT

## 2019-01-21 NOTE — ED Notes (Signed)
Formatting of this note might be different from the original.  Pt refused COVID testing.  She is pleasant and states that it is painful and uncomfortable.  Further she states she has been tested multiple times and that the [place] has a record of it.  Explained that pt could be negative one day and get the next, and that was reasoning behind collecting most up to date testing.  Still refused.  Electronically signed by Gar Gibbon at 01/21/2019 11:06 AM EDT

## 2019-01-22 NOTE — Progress Notes (Signed)
Formatting of this note is different from the original.  [facility]        Per patient's nurse, Marlowe Kays will p/u patient tomorrow morning at 11:30AM.     Electronically signed:  Shade Flood, MSW LCSW  01/22/2019 / 3:51 PM      Electronically signed by Shade Flood, MSW LCSW at 01/22/2019  3:52 PM EDT

## 2019-01-22 NOTE — ED Notes (Signed)
Formatting of this note might be different from the original.  In report from previous RN, this RN was told [facility] was going to pick patient up before 12:30 today. Access called this RN to confirm this was the case, this RN called [facility] to confirm. [facility] stated the patient's transport was cancelled. Charge RN notified as EMTALA paperwork will need to be restarted for this patient.  Electronically signed by Melvenia Beam at 01/22/2019  8:59 AM EDT

## 2019-01-22 NOTE — ED Notes (Signed)
Formatting of this note might be different from the original.  [facility] unable to give time for transport to [facility] hospital at this time, but will call back with transport time.  Electronically signed by Caroll Rancher, RN, BSN at 01/22/2019  2:11 PM EDT

## 2019-01-22 NOTE — Progress Notes (Signed)
Formatting of this note is different from the original.  [facility]  [facility] Psychiatry -  ED Follow-up    I have discussed the case with Dr. Britta Mccreedy, who has assisted in the formulation of the assessment and plan.    Date of Service: 01/22/2019   Assessment     Psychiatric Diagnoses:  Active Problems:    Suicide attempt by drug ingestion (*)    Severe episode of recurrent major depressive disorder, without psychotic features (*)    Medical Diagnoses:  None    Formulation and MDM:   Wendy Martin is a 33 y.o. female with a history of depression, PTSD who presented to the ED for suicide attempt by drug ingestion.  Patient was brought in to the ED taking approximately 15 Klonopin, drinking alcohol, smoked weed and cocaine.  While in the ED patient is unsteady on her feet and appeared to be drowsy.  Patient initially admitted to suicide attempt by drug ingestion that did not work and wanted to escape her stressors but at times she would denies suicide attempt.  Patient presentation suggests likely overdose will seek inpatient treatment.    The patient has been evaluated and determined to be medically stable by the ED provider.  Patient has been assessed by the ED [facility] Therapist and the findings have been discussed with this provider.  Psychiatry was consulted to assist with psychiatric assessment and treatment/disposition planning.  The chart has been reviewed and pertinent findings are noted below. Based on this review and assessment, the treatment plan has been created and discussed with the treatment team.     Based on my assessment, patient requires psychiatric hospitalization due to risk of self injury.    Treatment Plan & Recommendation     - Disposition:   - Seek Inpatient Psychiatric Hospitalization   - [facility]  - Commitment Status: Involuntary  - Precautions:   - suicide and fall  - Psych Med Recs:   - Wellbutrin 200mg  2 times a day; Klonopin  0.5mg  twice daily as needed  - Medical Recs:   - Defer to ED MD    Chief Complaint     Suicide attempt by drug ingestion  Depressed mood  Multiple stressors    History of Present Illness     Wendy Martin is a 33 y.o. female with a history of depression, PTSD who presented to the ED for suicide attempt by drug ingestion.  Patient was brought in to the ED taking approximately 15 Klonopin, drinking alcohol, smoked weed and cocaine.  While in the ED patient is unsteady on her feet and appeared to be drowsy.  Patient initially admitted to suicide attempt by drug ingestion that did not work and wanted to escape her stressors but at times she would denies suicide attempt.      Patient was seen this morning for re-evaluation.  Nursing reports no issues with patient and they are awaiting transport for patient for [facility].  Patient has been medication compliant.  The patient reports no side effects from medications.  Current symptoms being addressed include:  depression and anxiety.      On exam patient is observed lying in bed, calm and cooperative, alert and oriented x4.  Patient voices that she feels somewhat irritated since she has not been transported yet and there has been a delay.  Patient reports that she has had difficulty sleeping in the emergency department and voices that her appetite has not  been great because the food here is bad.  Patient reports that she is just ready to get to the [place]. patient reports that she was at the [place] 2 weeks ago under IVC for homicidal ideations.  Patient denies current SI, HI, or AVH.  Patient voices no concerns with Clinical research associate at time of assessment.    Current suicidal/homicidal ideations: Denies  Current auditory/visual hallucinations: Denies    Review Of Systems:  A complete review of systems of the following systems was conducted (Constitutional, Psychiatric, Neurological, Musculoskeletal, Eyes, Gastrointestinal, Cardiovascular, Respiratory, Skin, and Endocrine). All reviewed  systems are negative except pertinent positives identified in the HPI.    Past History     Past Medical History, Past Surgery History, Allergies, Social History, and Family History were reviewed and updated as appropriate.      Past Medical History:  Past Medical History:   Diagnosis Date   ? Depression    ? PTSD (post-traumatic stress disorder)      Family History:  History reviewed. No pertinent family history.    Social History:   Social History     Socioeconomic History   ? Marital status: Single     Spouse name: Not on file   ? Number of children: Not on file   ? Years of education: Not on file   ? Highest education level: Not on file   Occupational History   ? Not on file   Social Needs   ? Financial resource strain: Not on file   ? Food insecurity     Worry: Not on file     Inability: Not on file   ? Transportation needs     Medical: Not on file     Non-medical: Not on file   Tobacco Use   ? Smoking status: Current Every Day Smoker   ? Smokeless tobacco: Never Used   Substance and Sexual Activity   ? Alcohol use: Yes   ? Drug use: Yes     Types: Cocaine   ? Sexual activity: Not on file   Lifestyle   ? Physical activity     Days per week: Not on file     Minutes per session: Not on file   ? Stress: Not on file   Relationships   ? Social Wellsite geologist on phone: Not on file     Gets together: Not on file     Attends religious service: Not on file     Active member of club or organization: Not on file     Attends meetings of clubs or organizations: Not on file     Relationship status: Not on file   ? Intimate partner violence     Fear of current or ex partner: Not on file     Emotionally abused: Not on file     Physically abused: Not on file     Forced sexual activity: Not on file   Other Topics Concern   ? Not on file   Social History Narrative   ? Not on file     Evaluation     Vitals:   Vitals:    01/22/19 1716   BP: 119/84   Pulse: 72   Resp: 16   Temp: 97.8 F (36.6 C)   SpO2: 97%      Medications:  ? buPROPion  200 mg Oral BID     acetaminophen, clonazePAM, ondansetron **OR** ondansetron    Mental Status Evaluation  Constitutional:    General Appearance Wearing hospital scrubs and normal appearance    General Behavior Pleasant and cooperative   Musculoskeletal:    Gait and Station No gait abnormalities    Strength and tone Normal   Psychiatric:    Psychomotor Activity No motor abnormalities   Speech  Normal in rate/volume/tone   Mood  Depressed and Irritable   Affect  Depressed and Irritable   Thought Process Linear, logical, and goal directed   Associations Intact association   Thought Content/Perceptual Disturbances Patient denies suicidal/homicidal ideation; No evidence of auditory/visual hallucinations or delusions;   Cognition/Sensorium  AAOx4; Memory, attention, language, and fund of knowledge intact    Insight  Poor   Judgment Poor     Electronically signed by:  Margarita Sermons, PMHNP  01/22/2019 6:28 PM      Electronically signed by Lear Ng, MD at 01/22/2019  8:07 PM EDT

## 2019-01-22 NOTE — ED Notes (Signed)
Formatting of this note might be different from the original.  [facility] called to schedule time to go to [facility]. [facility] will call back with scheduled pick up time.  Electronically signed by Caroll Rancher, RN, BSN at 01/22/2019 11:53 AM EDT

## 2019-01-23 NOTE — ED Notes (Signed)
Formatting of this note might be different from the original.    Electronically signed by Rayetta Pigg, RN at 01/23/2019  8:51 AM EDT

## 2019-01-23 NOTE — ED Notes (Signed)
Formatting of this note might be different from the original.  Patient did speak to [employer] officer so her could reverse missing persons report, she spoke with him willingly after she called them herself  Electronically signed by Rayetta Pigg, RN at 01/23/2019  9:14 AM EDT

## 2019-01-23 NOTE — ED Notes (Signed)
Formatting of this note might be different from the original.  Patient sleeping at this time  Electronically signed by Rayetta Pigg, RN at 01/23/2019  7:33 AM EDT

## 2019-01-23 NOTE — ED Notes (Signed)
Formatting of this note might be different from the original.  To [employer] vehicle with one pink bag of belongings and one valuables envvelope  Electronically signed by Rayetta Pigg, RN at 01/23/2019 12:11 PM EDT

## 2019-01-23 NOTE — ED Notes (Signed)
Formatting of this note might be different from the original.  Patient awake and pleasant. Is aware of impending transfer to [place], on phone to family member notifying them now. States she has a missing persons report out on her, PSO called to help her resolve this. States she does consider her overdose "a failed suicide attempt", but Is able to contrAct for safety at this time. No SI at this time  Electronically signed by Rayetta Pigg, RN at 01/23/2019  8:03 AM EDT

## 2019-01-23 NOTE — ED Notes (Signed)
Formatting of this note might be different from the original.  Unable times 30 minutes to get an answer to give report, ashley in access contacted to see if she has another number  Electronically signed by Rayetta Pigg, RN at 01/23/2019 11:14 AM EDT

## 2019-01-23 NOTE — ED Notes (Signed)
Formatting of this note might be different from the original.  Patient aware she is going to [place], and waiting on transport. Continue to attempt to get someone on phone at [facility] for report  Electronically signed by Rayetta Pigg, RN at 01/23/2019 11:21 AM EDT

## 2019-01-23 NOTE — Progress Notes (Signed)
Formatting of this note is different from the original.  [facility]      Received call from Mariane Baumgarten Coord with the [place].  Requesting provider and/or nursing notes for the past 48 hrs for [facility] doc to review.  Vernona Rieger states that the bed is not being rescinded but [place] doc wanted update since pt was assigned a bed 2 days ago.     Electronically signed:  Mariah Milling, [facility]  01/23/2019 / 8:49 AM      Electronically signed by Mariah Milling, [facility] at 01/23/2019  8:50 AM EDT

## 2019-01-23 NOTE — Telephone Encounter (Signed)
LMTRC

## 2019-02-13 NOTE — Telephone Encounter (Signed)
Mailbox full

## 2019-03-15 ENCOUNTER — Telehealth: Payer: Self-pay | Admitting: Cardiology

## 2019-03-15 NOTE — Telephone Encounter (Signed)
LVM for patient to call to schedule 6 month f/u

## 2019-03-29 ENCOUNTER — Encounter: Payer: Self-pay | Admitting: Obstetrics and Gynecology

## 2019-04-03 NOTE — Progress Notes (Signed)
Letter and hard copy of results sent to pt re: MyRisk results. Had tried to call her multiple times over several months to go over results, but never heard back.   IBIS=29%. Pt notified she should start mammos age 33, do monthly SBE, yearly CBE, and discussed scr breast MRI in addition to mammos.  Patient notified these results only apply to her and her children, and this is not indicative of genetic testing results of her other family members. It is recommended that her other family members have genetic testing done.  Pt also notified negative genetic testing doesn't mean she will never get any of these cancers.
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
## 2019-10-01 ENCOUNTER — Inpatient Hospital Stay (HOSPITAL_COMMUNITY)
Admission: AD | Admit: 2019-10-01 | Discharge: 2019-10-04 | DRG: 885 | Disposition: A | Discharge status: Home / Self Care | Payer: 59 | Attending: Psychiatry | Admitting: Psychiatry

## 2019-10-01 DIAGNOSIS — Z87891 Personal history of nicotine dependence: Secondary | ICD-10-CM

## 2019-10-01 DIAGNOSIS — F332 Major depressive disorder, recurrent severe without psychotic features: Principal | ICD-10-CM | POA: Diagnosis present

## 2019-10-01 DIAGNOSIS — R45851 Suicidal ideations: Secondary | ICD-10-CM | POA: Diagnosis present

## 2019-10-01 DIAGNOSIS — Z915 Personal history of self-harm: Secondary | ICD-10-CM

## 2019-10-01 DIAGNOSIS — F4310 Post-traumatic stress disorder, unspecified: Secondary | ICD-10-CM | POA: Diagnosis present

## 2019-10-01 DIAGNOSIS — F331 Major depressive disorder, recurrent, moderate: Secondary | ICD-10-CM | POA: Diagnosis present

## 2019-10-01 DIAGNOSIS — Z20822 Contact with and (suspected) exposure to covid-19: Secondary | ICD-10-CM | POA: Diagnosis present

## 2019-10-01 DIAGNOSIS — G4700 Insomnia, unspecified: Secondary | ICD-10-CM | POA: Diagnosis present

## 2019-10-01 DIAGNOSIS — K219 Gastro-esophageal reflux disease without esophagitis: Secondary | ICD-10-CM | POA: Diagnosis present

## 2019-10-02 ENCOUNTER — Other Ambulatory Visit: Payer: Self-pay

## 2019-10-02 ENCOUNTER — Encounter (HOSPITAL_COMMUNITY): Payer: Self-pay | Admitting: Behavioral Health

## 2019-10-02 DIAGNOSIS — F331 Major depressive disorder, recurrent, moderate: Secondary | ICD-10-CM

## 2019-10-02 DIAGNOSIS — F4310 Post-traumatic stress disorder, unspecified: Secondary | ICD-10-CM | POA: Diagnosis present

## 2019-10-02 DIAGNOSIS — Z20822 Contact with and (suspected) exposure to covid-19: Secondary | ICD-10-CM | POA: Diagnosis present

## 2019-10-02 DIAGNOSIS — F332 Major depressive disorder, recurrent severe without psychotic features: Principal | ICD-10-CM

## 2019-10-02 DIAGNOSIS — R45851 Suicidal ideations: Secondary | ICD-10-CM | POA: Diagnosis present

## 2019-10-02 DIAGNOSIS — Z915 Personal history of self-harm: Secondary | ICD-10-CM | POA: Diagnosis not present

## 2019-10-02 DIAGNOSIS — G4700 Insomnia, unspecified: Secondary | ICD-10-CM | POA: Diagnosis present

## 2019-10-02 DIAGNOSIS — K219 Gastro-esophageal reflux disease without esophagitis: Secondary | ICD-10-CM | POA: Diagnosis present

## 2019-10-02 DIAGNOSIS — Z87891 Personal history of nicotine dependence: Secondary | ICD-10-CM | POA: Diagnosis not present

## 2019-10-02 HISTORY — DX: Major depressive disorder, recurrent, moderate: F33.1

## 2019-10-02 LAB — HEMOGLOBIN A1C
Hgb A1c MFr Bld: 4.9 % (ref 4.8–5.6)
Mean Plasma Glucose: 93.93 mg/dL

## 2019-10-02 LAB — URINALYSIS, COMPLETE (UACMP) WITH MICROSCOPIC
Bacteria, UA: NONE SEEN
Bilirubin Urine: NEGATIVE
Glucose, UA: NEGATIVE mg/dL
Hgb urine dipstick: NEGATIVE
Ketones, ur: NEGATIVE mg/dL
Nitrite: NEGATIVE
Protein, ur: 30 mg/dL — AB
Specific Gravity, Urine: 1.017 (ref 1.005–1.030)
pH: 7 (ref 5.0–8.0)

## 2019-10-02 LAB — COMPREHENSIVE METABOLIC PANEL
ALT: 19 U/L (ref 0–44)
AST: 21 U/L (ref 15–41)
Albumin: 4.6 g/dL (ref 3.5–5.0)
Alkaline Phosphatase: 70 U/L (ref 38–126)
Anion gap: 9 (ref 5–15)
BUN: 7 mg/dL (ref 6–20)
CO2: 22 mmol/L (ref 22–32)
Calcium: 9.4 mg/dL (ref 8.9–10.3)
Chloride: 108 mmol/L (ref 98–111)
Creatinine, Ser: 0.84 mg/dL (ref 0.44–1.00)
GFR calc Af Amer: >60 mL/min (ref >60)
GFR calc non Af Amer: >60 mL/min (ref >60)
Glucose, Bld: 93 mg/dL (ref 70–99)
Potassium: 3.7 mmol/L (ref 3.5–5.1)
Sodium: 139 mmol/L (ref 135–145)
Total Bilirubin: 0.9 mg/dL (ref 0.3–1.2)
Total Protein: 7.3 g/dL (ref 6.5–8.1)

## 2019-10-02 LAB — TSH: TSH: 1.646 u[IU]/mL (ref 0.350–4.500)

## 2019-10-02 LAB — LIPID PANEL
Cholesterol: 184 mg/dL (ref 0–200)
HDL: 54 mg/dL (ref >40)
Total CHOL/HDL Ratio: 3.4 RATIO
Triglycerides: 81 mg/dL (ref <150)
VLDL: 16 mg/dL (ref 0–40)

## 2019-10-02 LAB — RESPIRATORY PANEL BY RT PCR (FLU A&B, COVID)
Influenza A by PCR: NEGATIVE
Influenza B by PCR: NEGATIVE
SARS Coronavirus 2 by RT PCR: NEGATIVE

## 2019-10-02 LAB — HEPATIC FUNCTION PANEL
ALT: 17 U/L (ref 0–44)
AST: 22 U/L (ref 15–41)
Albumin: 4.5 g/dL (ref 3.5–5.0)
Alkaline Phosphatase: 69 U/L (ref 38–126)
Bilirubin, Direct: 0.2 mg/dL (ref 0.0–0.2)
Indirect Bilirubin: 0.5 mg/dL (ref 0.3–0.9)
Total Bilirubin: 0.7 mg/dL (ref 0.3–1.2)
Total Protein: 7.4 g/dL (ref 6.5–8.1)

## 2019-10-02 LAB — RAPID URINE DRUG SCREEN, HOSP PERFORMED
Amphetamines: NOT DETECTED
Barbiturates: NOT DETECTED
Benzodiazepines: NOT DETECTED
Cocaine: POSITIVE — AB
Opiates: NOT DETECTED
Tetrahydrocannabinol: POSITIVE — AB

## 2019-10-02 LAB — MAGNESIUM: Magnesium: 2.5 mg/dL — ABNORMAL HIGH (ref 1.7–2.4)

## 2019-10-02 LAB — HCG, QUANTITATIVE, PREGNANCY: hCG, Beta Chain, Quant, S: <1 m[IU]/mL (ref <5)

## 2019-10-02 LAB — ETHANOL: Alcohol, Ethyl (B): <10 mg/dL (ref <10)

## 2019-10-02 MED ORDER — FOLIC ACID 1 MG PO TABS
1.0000 mg | ORAL_TABLET | Freq: Every day | ORAL | Status: DC
  Administered 2019-10-02 – 2019-10-04 (×3): 1 mg via ORAL
  Filled 2019-10-02 (×5): qty 1

## 2019-10-02 MED ORDER — BACLOFEN 10 MG PO TABS
5.0000 mg | ORAL_TABLET | Freq: Three times a day (TID) | ORAL | Status: DC
  Administered 2019-10-02 – 2019-10-04 (×6): 5 mg via ORAL
  Filled 2019-10-02 (×2): qty 0.5
  Filled 2019-10-02: qty 1
  Filled 2019-10-02 (×2): qty 0.5
  Filled 2019-10-02: qty 1
  Filled 2019-10-02 (×3): qty 0.5
  Filled 2019-10-02: qty 1
  Filled 2019-10-02 (×2): qty 0.5

## 2019-10-02 MED ORDER — ACETAMINOPHEN 325 MG PO TABS
650.0000 mg | ORAL_TABLET | Freq: Four times a day (QID) | ORAL | Status: DC | PRN
  Administered 2019-10-02 – 2019-10-03 (×2): 650 mg via ORAL
  Filled 2019-10-02 (×2): qty 2

## 2019-10-02 MED ORDER — HYDROXYZINE HCL 25 MG PO TABS
25.0000 mg | ORAL_TABLET | Freq: Three times a day (TID) | ORAL | Status: DC | PRN
  Administered 2019-10-02 – 2019-10-03 (×4): 25 mg via ORAL
  Filled 2019-10-02 (×4): qty 1

## 2019-10-02 MED ORDER — ESCITALOPRAM OXALATE 10 MG PO TABS
10.0000 mg | ORAL_TABLET | Freq: Every day | ORAL | Status: DC
  Administered 2019-10-02 – 2019-10-04 (×3): 10 mg via ORAL
  Filled 2019-10-02 (×6): qty 1

## 2019-10-02 MED ORDER — THIAMINE HCL 100 MG PO TABS
100.0000 mg | ORAL_TABLET | Freq: Every day | ORAL | Status: DC
  Administered 2019-10-02 – 2019-10-04 (×3): 100 mg via ORAL
  Filled 2019-10-02 (×5): qty 1

## 2019-10-02 MED ORDER — MELOXICAM 7.5 MG PO TABS
7.5000 mg | ORAL_TABLET | Freq: Every day | ORAL | Status: DC
  Administered 2019-10-02 – 2019-10-04 (×3): 7.5 mg via ORAL
  Filled 2019-10-02 (×5): qty 1

## 2019-10-02 MED ORDER — TRAZODONE HCL 50 MG PO TABS
50.0000 mg | ORAL_TABLET | Freq: Every evening | ORAL | Status: DC | PRN
  Filled 2019-10-02: qty 1

## 2019-10-02 MED ORDER — ALUM & MAG HYDROXIDE-SIMETH 200-200-20 MG/5ML PO SUSP: 30.0000 mL | ORAL | Status: DC | PRN

## 2019-10-02 MED ORDER — LORAZEPAM 1 MG PO TABS: 1.0000 mg | ORAL_TABLET | Freq: Four times a day (QID) | ORAL | Status: DC | PRN

## 2019-10-02 MED ORDER — MAGNESIUM HYDROXIDE 400 MG/5ML PO SUSP: 30.0000 mL | Freq: Every day | ORAL | Status: DC | PRN

## 2019-10-02 MED ORDER — PANTOPRAZOLE SODIUM 40 MG PO TBEC
40.0000 mg | DELAYED_RELEASE_TABLET | Freq: Every day | ORAL | Status: DC
  Administered 2019-10-02 – 2019-10-04 (×3): 40 mg via ORAL
  Filled 2019-10-02 (×6): qty 1
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
## 2019-10-02 NOTE — [facility] Group Notes (Signed)
Pt did not attend wrap up group this evening. Pt was in their room.  
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
## 2019-10-02 NOTE — H&P (Signed)
Psychiatric Admission Assessment Adult  Patient Identification: Anna Bass MRN:  [id number] Date of Evaluation:  10/02/2019 Chief Complaint:  Suicidal Principal Diagnosis: MDD (major depressive disorder), recurrent episode, severe ([place]) Diagnosis:  Principal Problem:   MDD (major depressive disorder), recurrent episode, severe ([place])  History of Present Illness:   Anna Bass is a 34 y.o female who presents under IVC by her sister/POA Veleda Mun [phone] brought in by [facility].   Per IVC: "Respondent has been diagnosed with depression and anxiety. -She is constantly in and out of the home at night. She isn't sleeping. -Respondent has verbally stated and written statements of how the thought of death makes her happy. One of the things she has stated was she was thinking of taking her friend's gun and using it to end her life. -Respondent's behavior is on a rapid decline. -She plans to remove herself from the home without a place to go. -Excessive drug use daily (alcohol, weed, and cocaine)."  Pt reports family wanted her to come to [facility] due to being suicidal. Pt states " I have been suicidal my whole life, it got worse last year and I have been thinking about hurting myself". Pt reports depressive symptoms of anxiety, isolation, irritability, helplessness, worthlessness, hopelessness, tearfulness and guilt. Pt also reports vegetative symptoms and lack of motivation to self groom. Pt endorses current SI with no plans. She reports a history of self harm with scissors (in the past) and suicidal attempt in June 2020 by overdose on cocaine, alcohol 18 Wellbutrin and klonopin. Pt denies AVH. Pt endorses cocaine use $60-100 2x/wk, marijuana use 1 gm daily and 1 bottle of wine every month. Pt denies access to guns or weapons. She denies any history of abuse. Pt sees a therapist at the [place] but was unable to say if she has a psychiatrist. Pt reports she takes mirtazapine and lexapro. Pt  states she has trouble sleeping and her appetite is poor.   During evaluation pt is sitting; she is alert/oriented x 4; calm/cooperative; and mood is depressed congruent with affect. Patient is speaking in a clear tone at moderate volume, and normal pace; with good no eye contact. Her thought process is coherent and relevant; There is no indication that she is currently responding to internal/external stimuli or experiencing delusional thought content. Patient has remained calm throughout assessment and has answered questions appropriately.   Associated Signs/Symptoms: Depression Symptoms:  depressed mood, feelings of worthlessness/guilt, hopelessness, recurrent thoughts of death, suicidal thoughts without plan, anxiety, disturbed sleep, (Hypo) Manic Symptoms:  Impulsivity, Anxiety Symptoms:  Excessive Worry, Psychotic Symptoms:  NA PTSD Symptoms: NA Total Time spent with patient: 1 hour  Past Psychiatric History: Yes  Is the patient at risk to self? Yes.    Has the patient been a risk to self in the past 6 months? Yes.    Has the patient been a risk to self within the distant past? Yes.    Is the patient a risk to others? No.  Has the patient been a risk to others in the past 6 months? No.  Has the patient been a risk to others within the distant past? No.   Prior Inpatient Therapy: Prior Inpatient Therapy: Yes Prior Therapy Dates: 01/23/2019 - 01/29/2019 and 05/16/2019 - 05/24/2019 Prior Therapy Facilty/Provider(s): [facility] Reason for Treatment: SI with a plan, MDD Prior Outpatient Therapy: Prior Outpatient Therapy: No Does patient have an ACCT team?: No Does patient have Intensive In-House Services?  : No Does patient have  [facility] services? : No Does patient have P4CC services?: No  Alcohol Screening: 1. How often do you have a drink containing alcohol?: 2 to 3 times a week 2. How many drinks containing alcohol do you have on a typical day when you are drinking?:  5 or 6 3. How often do you have six or more drinks on one occasion?: Weekly AUDIT-C Score: 8 4. How often during the last year have you found that you were not able to stop drinking once you had started?: Weekly 5. How often during the last year have you failed to do what was normally expected from you becasue of drinking?: Weekly 6. How often during the last year have you needed a first drink in the morning to get yourself going after a heavy drinking session?: Weekly 7. How often during the last year have you had a feeling of guilt of remorse after drinking?: Never 8. How often during the last year have you been unable to remember what happened the night before because you had been drinking?: Less than monthly 9. Have you or someone else been injured as a result of your drinking?: No 10. Has a relative or friend or a doctor or another health worker been concerned about your drinking or suggested you cut down?: No Alcohol Use Disorder Identification Test Final Score (AUDIT): 18 Alcohol Brief Interventions/Follow-up: Brief Advice, Continued Monitoring Substance Abuse History in the last 12 months:  Yes.   Consequences of Substance Abuse: Negative Previous Psychotropic Medications: Yes  Psychological Evaluations: Yes  Past Medical History:  Past Medical History:  Diagnosis Date  . BRCA negative 11/2018   MyRisk neg  . Depression   . Family history of breast cancer   . Frequent headaches   . Increased risk of breast cancer 11/2018   IBIS=29.2%  . Obesity, Class I, BMI 30-34.9 09/25/2015   resolved  . Smoker 09/25/2015    Past Surgical History:  Procedure Laterality Date  . CHALAZION EXCISION     Family History:  Family History  Problem Relation Age of Onset  . Cancer Mother 76       breast  . Leukemia Mother 61       found during dengue fever treatment  . Cancer Maternal Grandmother 48       breast and brain  . Stroke Maternal Grandfather   . Heart disease Maternal  Grandfather        ?stent or pacer  . Breast cancer Paternal Grandmother 25  . Brain cancer Paternal Grandmother 48  . Prostate cancer Paternal Grandfather 44  . Ovarian cysts Sister   . Diabetes Neg Hx   . Ovarian cancer Neg Hx    Family Psychiatric  History: Yes Tobacco Screening:   Social History:  Social History   Substance and Sexual Activity  Alcohol Use Yes  . Alcohol/week: 0.0 standard drinks   Comment: occasional     Social History   Substance and Sexual Activity  Drug Use No   Comment: prior MJ use    Additional Social History: Marital status: Divorced    Pain Medications: Please see MAR Prescriptions: Please see MAR Over the Counter: Please see MAR Longest period of sobriety (when/how long): Unknown Name of Substance 1: Cocaine 1 - Age of First Use: 32 1 - Amount (size/oz): $60 - $100 1 - Frequency: Every two weeks 1 - Duration: Unknown 1 - Last Use / Amount: Saturday (09/30/2019) Name of Substance 2: Marijuana 2 - Age of First  Use: 17 2 - Amount (size/oz): 1 gram 2 - Frequency: Daily 2 - Duration: Unknown 2 - Last Use / Amount: Today (10/01/2019) Name of Substance 3: EtOH 3 - Age of First Use: 17 3 - Amount (size/oz): 1 bottle of wine 3 - Frequency: 1x/month 3 - Duration: Unknown 3 - Last Use / Amount: Today (10/01/2019)              Allergies:   Allergies  Allergen Reactions  . Sulfa Antibiotics     Told allergic to this by blood test in [employer]   Lab Results:  Results for orders placed or performed during the hospital encounter of 10/01/19 (from the past 48 hour(s))  Respiratory Panel by RT PCR (Flu A&B, Covid) - Nasopharyngeal Swab     Status: None   Collection Time: 10/02/19 12:13 AM   Specimen: Nasopharyngeal Swab  Result Value Ref Range   SARS Coronavirus 2 by RT PCR NEGATIVE NEGATIVE    Comment: (NOTE) SARS-CoV-2 target nucleic acids are NOT DETECTED. The SARS-CoV-2 RNA is generally detectable in upper respiratoy specimens during  the acute phase of infection. The lowest concentration of SARS-CoV-2 viral copies this assay can detect is 131 copies/mL. A negative result does not preclude SARS-Cov-2 infection and should not be used as the sole basis for treatment or other patient management decisions. A negative result may occur with  improper specimen collection/handling, submission of specimen other than nasopharyngeal swab, presence of viral mutation(s) within the areas targeted by this assay, and inadequate number of viral copies (<131 copies/mL). A negative result must be combined with clinical observations, patient history, and epidemiological information. The expected result is Negative. Fact Sheet for Patients:  PinkCheek.be Fact Sheet for Healthcare Providers:  GravelBags.it This test is not yet ap proved or cleared by the [place] FDA and  has been authorized for detection and/or diagnosis of SARS-CoV-2 by FDA under an Emergency Use Authorization (EUA). This EUA will remain  in effect (meaning this test can be used) for the duration of the COVID-19 declaration under Section 564(b)(1) of the Act, 21 U.S.C. section 360bbb-3(b)(1), unless the authorization is terminated or revoked sooner.    Influenza A by PCR NEGATIVE NEGATIVE   Influenza B by PCR NEGATIVE NEGATIVE    Comment: (NOTE) The Xpert Xpress SARS-CoV-2/FLU/RSV assay is intended as an aid in  the diagnosis of influenza from Nasopharyngeal swab specimens and  should not be used as a sole basis for treatment. Nasal washings and  aspirates are unacceptable for Xpert Xpress SARS-CoV-2/FLU/RSV  testing. Fact Sheet for Patients: PinkCheek.be Fact Sheet for Healthcare Providers: GravelBags.it This test is not yet approved or cleared by the [place] FDA and  has been authorized for detection and/or diagnosis of SARS-CoV-2 by  FDA  under an Emergency Use Authorization (EUA). This EUA will remain  in effect (meaning this test can be used) for the duration of the  Covid-19 declaration under Section 564(b)(1) of the Act, 21  U.S.C. section 360bbb-3(b)(1), unless the authorization is  terminated or revoked. Performed at [facility], [place] [address] [place], [place] [address]     Blood Alcohol level:  No results found for: [facility]  Metabolic Disorder Labs:  No results found for: HGBA1C, MPG No results found for: PROLACTIN No results found for: CHOL, TRIG, HDL, CHOLHDL, VLDL, LDLCALC  Current Medications: Current Facility-Administered Medications  Medication Dose Route Frequency Provider Last Rate Last Admin  . acetaminophen (TYLENOL) tablet 650 mg  650 mg  Oral Q6H PRN Kerrion Kemppainen C, NP      . alum & mag hydroxide-simeth (MAALOX/MYLANTA) 200-200-20 MG/5ML suspension 30 mL  30 mL Oral Q4H PRN Kimari Lienhard C, NP      . hydrOXYzine (ATARAX/VISTARIL) tablet 25 mg  25 mg Oral TID PRN Rhea Thrun C, NP   25 mg at 10/02/19 0244  . magnesium hydroxide (MILK OF MAGNESIA) suspension 30 mL  30 mL Oral Daily PRN Larena Ohnemus C, NP      . traZODone (DESYREL) tablet 50 mg  50 mg Oral QHS PRN Doyt Castellana C, NP       PTA Medications: Medications Prior to Admission  Medication Sig Dispense Refill Last Dose  . buPROPion (WELLBUTRIN SR) 200 MG 12 hr tablet TK 1 T PO  BID   Unknown at Unknown time  . clonazePAM (KLONOPIN) 0.5 MG tablet TK 1 T PO BID   Unknown at Unknown time    Musculoskeletal: Strength & Muscle Tone: within normal limits Gait & Station: normal Patient leans: N/A  Psychiatric Specialty Exam: Physical Exam  Constitutional: She is oriented to person, place, and time. She appears well-developed.  HENT:  Head: Normocephalic.  Eyes: Pupils are equal, round, and reactive to light.  Respiratory: Effort normal.  Musculoskeletal:        General: Normal range of motion.     Cervical back: Normal  range of motion.  Neurological: She is alert and oriented to person, place, and time.  Skin: Skin is warm and dry.  Psychiatric: Her speech is normal and behavior is normal. Cognition and memory are normal. She expresses impulsivity. She exhibits a depressed mood. She expresses suicidal ideation.    Review of Systems  Psychiatric/Behavioral: Positive for confusion, dysphoric mood, sleep disturbance and suicidal ideas. Negative for behavioral problems and hallucinations. The patient is not nervous/anxious and is not hyperactive.   All other systems reviewed and are negative.   Blood pressure 136/86, pulse 97, temperature 98.2 F (36.8 C), temperature source Oral, resp. rate 20, SpO2 100 %.There is no height or weight on file to calculate BMI.  General Appearance: Casual and Fairly Groomed  Eye Contact:  Absent  Speech:  Normal Rate  Volume:  Decreased  Mood:  Depressed, Hopeless, Irritable and Worthless  Affect:  Congruent and Depressed  Thought Process:  Coherent and Descriptions of Associations: Intact  Orientation:  Full (Time, Place, and Person)  Thought Content:  WDL  Suicidal Thoughts:  Yes.  without intent/plan  Homicidal Thoughts:  No  Memory:  Recent;   Good  Judgement:  Impaired  Insight:  Fair  Psychomotor Activity:  Normal  Concentration:  Concentration: Good  Recall:  Good  Fund of Knowledge:  Good  Language:  Good  Akathisia:  No  Handed:  Right  AIMS (if indicated):     Assets:  Communication Skills Desire for Improvement Financial Resources/Insurance Housing Social Support Transportation Vocational/Educational  ADL's:  Impaired  Cognition:  WNL  Sleep:      Disposition: Recommend psychiatric Inpatient admission when medically cleared. Supportive therapy provided about ongoing stressors.   Treatment Plan Summary: Daily contact with patient to assess and evaluate symptoms and progress in treatment and Medication management  Observation  Level/Precautions:  15 minute checks  Laboratory:  Chemistry Profile UDS  Psychotherapy:    Medications:    Consultations:    Discharge Concerns:    Estimated LOS:  Other:     Physician Treatment Plan for Primary Diagnosis: MDD (major depressive  disorder), recurrent episode, severe ([place]) Long Term Goal(s): Improvement in symptoms so as ready for discharge  Short Term Goals: Ability to identify changes in lifestyle to reduce recurrence of condition will improve, Ability to demonstrate self-control will improve, Ability to identify and develop effective coping behaviors will improve, Ability to maintain clinical measurements within normal limits will improve and Ability to identify triggers associated with substance abuse/mental health issues will improve  Physician Treatment Plan for Secondary Diagnosis: Principal Problem:   MDD (major depressive disorder), recurrent episode, severe ([place])  Long Term Goal(s): Improvement in symptoms so as ready for discharge  Short Term Goals: Ability to identify changes in lifestyle to reduce recurrence of condition will improve, Ability to demonstrate self-control will improve, Ability to identify and develop effective coping behaviors will improve, Ability to maintain clinical measurements within normal limits will improve and Ability to identify triggers associated with substance abuse/mental health issues will improve  I certify that inpatient services furnished can reasonably be expected to improve the patient's condition.    Mliss Fritz, NP 3/9/20214:18 AM

## 2019-10-02 NOTE — Progress Notes (Signed)
Recreation Therapy Notes  Animal-Assisted Activity (AAA) Program Checklist/Progress Notes Patient Eligibility Criteria Checklist & Daily Group note for Rec Tx Intervention  Date: 3.9.21 Time: 1430 Location: [address] Dayroom   AAA/T Program Assumption of Risk Form signed by Patient/ or Parent Legal Guardian YES  Patient is free of allergies or sever asthma  YES   Patient reports no fear of animals YES   Patient reports no history of cruelty to animals  YES  Patient understands his/her participation is voluntary YES   Patient washes hands before animal contact YES  Patient washes hands after animal contact YES   Behavioral Response: Engaged  Education: Hand Washing, Appropriate Animal Interaction   Education Outcome: Acknowledges understanding/In group clarification offered/Needs additional education.   Clinical Observations/Feedback: Pt attended and participated in activity.    [name], LRT/CTRS         Anna Bass A 10/02/2019 3:30 PM 

## 2019-10-02 NOTE — Progress Notes (Signed)
Patient ID: Anna Bass, female   DOB: 1986/07/11, 34 y.o.   MRN: [id number] Pt A&O x 4, under IVC obtained by sister, pt presents with SI, plan to take her friends gun and end her life.  Pt is an Investment banker, operational.  Pt admits to polysubstance abuse(Wine, Marijuana & Cocaine).  History of Depression and Anxiety noted.  Pt calm & cooperative.  Skin search completed.  Monitoring for safety.

## 2019-10-02 NOTE — Progress Notes (Signed)
D: Pt presents as anxious and depressed.  Denied HI/SI/AVH at this time. Reported anxiety is "the main issue."   A: RN administered medications as prescribed.  RN Worked on Pharmacologist with pt and gaining pt's trust.  RN provided reassurance and support.  R:Pt has been calm and verbalized understanding of her plan of care and working with her interdisciplinary team.  Pt is safe on the unit.   RN will continue to monitor and provide support as needed.

## 2019-10-02 NOTE — [facility] Suicide Risk Assessment (Signed)
[facility] INPATIENT:  Family/Significant Other Suicide Prevention Education  Suicide Prevention Education:  Education Completed; Tiffany Kocher, mother [phone] has been identified by the patient as the family member/significant other with whom the patient will be residing, and identified as the person(s) who will aid the patient in the event of a mental health crisis (suicidal ideations/suicide attempt).  With written consent from the patient, the family member/significant other has been provided the following suicide prevention education, prior to the and/or following the discharge of the patient.  The suicide prevention education provided includes the following:  Suicide risk factors  Suicide prevention and interventions  National Suicide Hotline telephone number  [facility] assessment telephone number  [facility] Emergency Assistance 911  [facility] and/or Residential Mobile Crisis Unit telephone number  Request made of family/significant other to:  Remove weapons (e.g., guns, rifles, knives), all items previously/currently identified as safety concern.    Remove drugs/medications (over-the-counter, prescriptions, illicit drugs), all items previously/currently identified as a safety concern.  The family member/significant other verbalizes understanding of the suicide prevention education information provided.  The family member/significant other agrees to remove the items of safety concern listed above. Ms. Earlene Plater reports the pt was brought to the hospital due to experiencing suicidal ideation. 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  Ms. Earlene Plater after the pt last hospitalization a few months ago, they arranged for the pt to live with  her sister. Ms. Earlene Plater says she suspects the pt may have adult ADD but has no formal diagnosis. She states the pt has difficulty executing a plan and does not follow through with outpatient treatment. She denies the pt having access to guns or weapons in the home.  Kary Sugrue T Girtha Kilgore 10/02/2019, 11:36 AM
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
## 2019-10-02 NOTE — [facility] Suicide Risk Assessment (Signed)
[facility] INPATIENT:  Family/Significant Other Suicide Prevention Education  Suicide Prevention Education:  Contact Attempts: [place], sister [phone] has been identified by the patient as the family member/significant other with whom the patient will be residing, and identified as the person(s) who will aid the patient in the event of a mental health crisis.  With written consent from the patient, two attempts were made to provide suicide prevention education, prior to and/or following the patient's discharge.  We were unsuccessful in providing suicide prevention education.  A suicide education pamphlet was given to the patient to share with family/significant other.  Date and time of first attempt: 10/02/19 1122am CSW unable to leave vm due to mailbox being full. Date and time of second attempt: 2nd attempt needed  Anna Bass 10/02/2019, 11:34 AM

## 2019-10-02 NOTE — Tx Team (Signed)
Initial Treatment Plan 10/02/2019 4:07 AM Anna Bass EHU:[id number]    PATIENT STRESSORS: Marital or family conflict Medication change or noncompliance Substance abuse   PATIENT STRENGTHS: General fund of knowledge Motivation for treatment/growth   PATIENT IDENTIFIED PROBLEMS: Risk for suicide  depression  SA-ETOH / THC / Cocaine  "nothing"               DISCHARGE CRITERIA:  Improved stabilization in mood, thinking, and/or behavior Verbal commitment to aftercare and medication compliance  PRELIMINARY DISCHARGE PLAN: Attend aftercare/continuing care group Outpatient therapy  PATIENT/FAMILY INVOLVEMENT: This treatment plan has been presented to and reviewed with the patient, Anna Bass.  The patient and family have been given the opportunity to ask questions and make suggestions.  Delos Haring, RN 10/02/2019, 4:07 AM

## 2019-10-03 MED ORDER — MIRTAZAPINE 15 MG PO TABS
15.0000 mg | ORAL_TABLET | Freq: Every day | ORAL | Status: DC
  Administered 2019-10-03: 15 mg via ORAL
  Filled 2019-10-03 (×4): qty 1

## 2019-10-03 NOTE — Progress Notes (Signed)
The patient's positive event for the day is that she had a good talk with the MT on day shift. She is complaining of having pain all over. She is proud of the fact that she slept a bit today. Her goal for tomorrow is to get discharged.

## 2019-10-03 NOTE — Progress Notes (Signed)
Adult Psychoeducational Group Note  Date:  10/03/2019 Time:  10:51 AM  Group Topic/Focus:  Personal Choices and Values:   The focus of this group is to help patients assess and explore the importance of values in their lives, how their values affect their decisions, how they express their values and what opposes their expression.  Participation Level:  Active  Participation Quality:  Appropriate and Attentive  Affect:  Appropriate and Irritable  Cognitive:  Appropriate  Insight: Appropriate and Good  Engagement in Group:  Engaged  Modes of Intervention:  Discussion  Additional Comments:  Pt has a goal to improve home-life and find a new job. Pt was prompted to reflect on the values worksheet to prepare for new priorities once discharged.  Anna Bass Anna Bass 10/03/2019, 10:51 AM

## 2019-10-03 NOTE — Progress Notes (Signed)
   10/03/19 0000  Psych Admission Type (Psych Patients Only)  Admission Status Involuntary  Psychosocial Assessment  Patient Complaints Anxiety  Eye Contact Fair  Facial Expression Flat  Affect Flat  Speech Logical/coherent  Interaction Assertive  Motor Activity Slow  Appearance/Hygiene Unremarkable  Behavior Characteristics Cooperative  Mood Pleasant  Aggressive Behavior  Effect No apparent injury  Thought Process  Coherency WDL  Content WDL  Delusions WDL  Perception WDL  Hallucination None reported or observed  Judgment Impaired  Confusion None  Danger to Self  Current suicidal ideation? Denies  Self-Injurious Behavior No self-injurious ideation or behavior indicators observed or expressed   Agreement Not to Harm Self Yes  Description of Agreement  (verbally contracts for safety)  Danger to Others  Danger to Others None reported or observed

## 2019-10-03 NOTE — Tx Team (Signed)
Interdisciplinary Treatment and Diagnostic Plan Update  10/03/2019 Time of Session: 9:50am Anna Bass MRN: [id number]  Principal Diagnosis: MDD (major depressive disorder), recurrent episode, severe ([place])  Secondary Diagnoses: Principal Problem:   MDD (major depressive disorder), recurrent episode, severe ([place])   Current Medications:  Current Facility-Administered Medications  Medication Dose Route Frequency Provider Last Rate Last Admin  . acetaminophen (TYLENOL) tablet 650 mg  650 mg Oral Q6H PRN Anike, Adaku C, NP   650 mg at 10/02/19 2154  . alum & mag hydroxide-simeth (MAALOX/MYLANTA) 200-200-20 MG/5ML suspension 30 mL  30 mL Oral Q4H PRN Anike, Adaku C, NP      . baclofen (LIORESAL) tablet 5 mg  5 mg Oral TID Cobos, Myer Peer, MD   5 mg at 10/03/19 0736  . escitalopram (LEXAPRO) tablet 10 mg  10 mg Oral Daily Sharma Covert, MD   10 mg at 10/03/19 0737  . folic acid (FOLVITE) tablet 1 mg  1 mg Oral Daily Sharma Covert, MD   1 mg at 10/03/19 0736  . hydrOXYzine (ATARAX/VISTARIL) tablet 25 mg  25 mg Oral TID PRN Anike, Adaku C, NP   25 mg at 10/03/19 1036  . LORazepam (ATIVAN) tablet 1 mg  1 mg Oral Q6H PRN Sharma Covert, MD      . magnesium hydroxide (MILK OF MAGNESIA) suspension 30 mL  30 mL Oral Daily PRN Anike, Adaku C, NP      . meloxicam (MOBIC) tablet 7.5 mg  7.5 mg Oral Daily Sharma Covert, MD   7.5 mg at 10/03/19 0737  . mirtazapine (REMERON) tablet 15 mg  15 mg Oral QHS Nwoko, Agnes I, NP      . pantoprazole (PROTONIX) EC tablet 40 mg  40 mg Oral Daily Sharma Covert, MD   40 mg at 10/03/19 0736  . thiamine tablet 100 mg  100 mg Oral Daily Sharma Covert, MD   100 mg at 10/03/19 6213   PTA Medications: Medications Prior to Admission  Medication Sig Dispense Refill Last Dose  . baclofen (LIORESAL) 10 MG tablet Take 10 mg by mouth 3 (three) times daily.     Marland Kitchen buPROPion (WELLBUTRIN SR) 200 MG 12 hr tablet Take 200 mg by mouth 2 (two)  times daily.      Marland Kitchen ibuprofen (ADVIL) 800 MG tablet Take 800 mg by mouth 3 (three) times daily.     . meloxicam (MOBIC) 15 MG tablet Take 15 mg by mouth daily.       Patient Stressors: Marital or family conflict Medication change or noncompliance Substance abuse  Patient Strengths: Technical sales engineer for treatment/growth  Treatment Modalities: Medication Management, Group therapy, Case management,  1 to 1 session with clinician, Psychoeducation, Recreational therapy.   Physician Treatment Plan for Primary Diagnosis: MDD (major depressive disorder), recurrent episode, severe ([place]) Long Term Goal(s): Improvement in symptoms so as ready for discharge Improvement in symptoms so as ready for discharge   Short Term Goals: Ability to identify changes in lifestyle to reduce recurrence of condition will improve Ability to verbalize feelings will improve Ability to disclose and discuss suicidal ideas Ability to demonstrate self-control will improve Ability to identify and develop effective coping behaviors will improve Ability to maintain clinical measurements within normal limits will improve Compliance with prescribed medications will improve Ability to identify triggers associated with substance abuse/mental health issues will improve Ability to identify changes in lifestyle to reduce recurrence of condition will improve Ability to  verbalize feelings will improve Ability to disclose and discuss suicidal ideas Ability to demonstrate self-control will improve Ability to identify and develop effective coping behaviors will improve Ability to maintain clinical measurements within normal limits will improve Compliance with prescribed medications will improve Ability to identify triggers associated with substance abuse/mental health issues will improve  Medication Management: Evaluate patient's response, side effects, and tolerance of medication regimen.  Therapeutic  Interventions: 1 to 1 sessions, Unit Group sessions and Medication administration.  Evaluation of Outcomes: Not Met  Physician Treatment Plan for Secondary Diagnosis: Principal Problem:   MDD (major depressive disorder), recurrent episode, severe ([place])  Long Term Goal(s): Improvement in symptoms so as ready for discharge Improvement in symptoms so as ready for discharge   Short Term Goals: Ability to identify changes in lifestyle to reduce recurrence of condition will improve Ability to verbalize feelings will improve Ability to disclose and discuss suicidal ideas Ability to demonstrate self-control will improve Ability to identify and develop effective coping behaviors will improve Ability to maintain clinical measurements within normal limits will improve Compliance with prescribed medications will improve Ability to identify triggers associated with substance abuse/mental health issues will improve Ability to identify changes in lifestyle to reduce recurrence of condition will improve Ability to verbalize feelings will improve Ability to disclose and discuss suicidal ideas Ability to demonstrate self-control will improve Ability to identify and develop effective coping behaviors will improve Ability to maintain clinical measurements within normal limits will improve Compliance with prescribed medications will improve Ability to identify triggers associated with substance abuse/mental health issues will improve     Medication Management: Evaluate patient's response, side effects, and tolerance of medication regimen.  Therapeutic Interventions: 1 to 1 sessions, Unit Group sessions and Medication administration.  Evaluation of Outcomes: Not Met   RN Treatment Plan for Primary Diagnosis: MDD (major depressive disorder), recurrent episode, severe ([place]) Long Term Goal(s): Knowledge of disease and therapeutic regimen to maintain health will improve  Short Term Goals: Ability to  participate in decision making will improve, Ability to verbalize feelings will improve, Ability to disclose and discuss suicidal ideas, Ability to identify and develop effective coping behaviors will improve and Compliance with prescribed medications will improve  Medication Management: RN will administer medications as ordered by provider, will assess and evaluate patient's response and provide education to patient for prescribed medication. RN will report any adverse and/or side effects to prescribing provider.  Therapeutic Interventions: 1 on 1 counseling sessions, Psychoeducation, Medication administration, Evaluate responses to treatment, Monitor vital signs and CBGs as ordered, Perform/monitor CIWA, COWS, AIMS and Fall Risk screenings as ordered, Perform wound care treatments as ordered.  Evaluation of Outcomes: Not Met   LCSW Treatment Plan for Primary Diagnosis: MDD (major depressive disorder), recurrent episode, severe ([place]) Long Term Goal(s): Safe transition to appropriate next level of care at discharge, Engage patient in therapeutic group addressing interpersonal concerns.  Short Term Goals: Engage patient in aftercare planning with referrals and resources  Therapeutic Interventions: Assess for all discharge needs, 1 to 1 time with Social worker, Explore available resources and support systems, Assess for adequacy in community support network, Educate family and significant other(s) on suicide prevention, Complete Psychosocial Assessment, Interpersonal group therapy.  Evaluation of Outcomes: Not Met   Progress in Treatment: Attending groups: No. Participating in groups: No. Taking medication as prescribed: Yes. Toleration medication: Yes. Family/Significant other contact made: Yes, individual(s) contacted:  the patient's mother Patient understands diagnosis: Yes. Discussing patient identified problems/goals with staff: Yes.  Medical problems stabilized or resolved: Yes. Denies  suicidal/homicidal ideation: Yes. Issues/concerns per patient self-inventory: No. Other:   New problem(s) identified: None   New Short Term/Long Term Goal(s): medication stabilization, elimination of SI thoughts, development of comprehensive mental wellness plan.    Patient Goals: "To be at peace"    Discharge Plan or Barriers: Patient recently admitted. CSW will continue to follow and assess for appropriate referrals and possible discharge planning.    Reason for Continuation of Hospitalization: Anxiety Depression Medication stabilization Suicidal ideation  Estimated Length of Stay: 3-5 days   Attendees: Patient: Anna Bass 10/03/2019 11:30 AM  Physician: Dr. Myles Lipps, MD 10/03/2019 11:30 AM  Nursing:  10/03/2019 11:30 AM  RN Care Manager: 10/03/2019 11:30 AM  Social Worker: Radonna Ricker, LCSW 10/03/2019 11:30 AM  Recreational Therapist:  10/03/2019 11:30 AM  Other:  10/03/2019 11:30 AM  Other:  10/03/2019 11:30 AM  Other: 10/03/2019 11:30 AM    Scribe for Treatment Team: Marylee Floras, [place] 10/03/2019 11:30 AM

## 2019-10-03 NOTE — Progress Notes (Signed)
[facility] MD Progress Note  10/03/2019 9:54 AM Anna Bass  MRN:  [id number]  Subjective: Anna Bass reports, "I'm feeling good today, by my depression has been an ongoing everyday struggle. But, my mind is clearer to realized what was causing me the depression as well body pain. 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 I know now that I need to find another job after discharge. I did not sleep well last night because I was not on my Remeron & Melatonin. I take bot at night to help me sleep. I'm no longer feeling suicidal today".  Objective: Patient is a 34 year old female with a past psychiatric history significant for polysubstance use disorder including alcohol, marijuana and cocaine, asymptomatic posttraumatic stress disorder, suicidal ideation and depression who was placed under involuntary commitment by her sister and taken [facility]. According to the involuntary commitment paperwork the patient had been's constantly in and out of the home, that she was not sleeping, and had verbally stated and had written statements of how the thought of her death made her happy. She stated that she was going to take a friend's gun and use it ending her life. It also noted excessive drug use of alcohol, marijuana and cocaine. Anna Bass is seen, chart reviewed. The chart findings discussed with the treatment team. She presents alert, oriented x 3 & aware of situation. She is visible on the unit, attending group sessions. She reports feeling a lot better today, however, endorsed that she realized now that her depression has been an ongoing daily struggle. She also says she knows the reason for her depression,  Has be to be related to her current job. 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 She described the job as tedious. Besides the depression that she associated with her current job, she is also complaining of generalized boy aches & pain. She is also blaming the  government for not approving her disability application. She says today that she has decided to quit this job, however, it has to be when she finds another job. She currently denies any SIHI, AVH, delusional thoughts or paranoia. She does not appear to be responding to any internal stimuli. Anna Bass is in agreement to continue current plan of care as already in progress. Patient says she will be ready for a discharge to her home with family tomorrow.   Principal Problem: MDD (major depressive disorder), recurrent episode, severe ([place])  Diagnosis: Principal Problem:   MDD (major depressive disorder), recurrent episode, severe ([place])  Total Time spent with patient: Greater than 30 minutes  Past Psychiatric History: See H&P  Past Medical History:  Past Medical History:  Diagnosis Date  . BRCA negative 11/2018   MyRisk neg  . Depression   . Family history of breast cancer   . Frequent headaches   . Increased risk of breast cancer 11/2018   IBIS=29.2%  . Obesity, Class I, BMI 30-34.9 09/25/2015   resolved  . Smoker 09/25/2015    Past Surgical History:  Procedure Laterality Date  . CHALAZION EXCISION     Family History:  Family History  Problem Relation Age of Onset  . Cancer Mother 32       breast  . Leukemia Mother 74       found during dengue fever treatment  . Cancer Maternal Grandmother 48       breast and brain  . Stroke Maternal Grandfather   . Heart  disease Maternal Grandfather        ?stent or pacer  . Breast cancer Paternal Grandmother 43  . Brain cancer Paternal Grandmother 47  . Prostate cancer Paternal Grandfather 27  . Ovarian cysts Sister   . Diabetes Neg Hx   . Ovarian cancer Neg Hx    Family Psychiatric  History: See H&P  Social History:  Social History   Substance and Sexual Activity  Alcohol Use Yes  . Alcohol/week: 0.0 standard drinks   Comment: occasional     Social History   Substance and Sexual Activity  Drug Use No   Comment: prior MJ use     Social History   Socioeconomic History  . Marital status: Single    Spouse name: Not on file  . Number of children: Not on file  . Years of education: Not on file  . Highest education level: Not on file  Occupational History  . Not on file  Tobacco Use  . Smoking status: Former Smoker    Packs/day: 0.50    Years: 10.00    Pack years: 5.00    Types: Cigarettes    Start date: 07/26/2006    Quit date: 08/02/2018    Years since quitting: 1.1  . Smokeless tobacco: Never Used  Substance and Sexual Activity  . Alcohol use: Yes    Alcohol/week: 0.0 standard drinks    Comment: occasional  . Drug use: No    Comment: prior MJ use  . Sexual activity: Yes    Birth control/protection: Condom  Other Topics Concern  . Not on file  Social History Narrative   Lives with cousin, son and mother   Edu: college - nursing --> communications degree   Occupation: Chief Strategy Officer: [employer]   Activity: no regular exercise   Diet: fruits/vegetables daily, some water   Social Determinants of Radio broadcast assistant Strain:   . Difficulty of Paying Living Expenses: Not on file  Food Insecurity:   . Worried About Charity fundraiser in the Last Year: Not on file  . Ran Out of Food in the Last Year: Not on file  Transportation Needs:   . Lack of Transportation (Medical): Not on file  . Lack of Transportation (Non-Medical): Not on file  Physical Activity:   . Days of Exercise per Week: Not on file  . Minutes of Exercise per Session: Not on file  Stress:   . Feeling of Stress : Not on file  Social Connections:   . Frequency of Communication with Friends and Family: Not on file  . Frequency of Social Gatherings with Friends and Family: Not on file  . Attends Religious Services: Not on file  . Active Member of Clubs or Organizations: Not on file  . Attends Archivist Meetings: Not on file  . Marital Status: Not on file   Additional Social History:    Pain Medications:  Please see MAR Prescriptions: Please see MAR Over the Counter: Please see MAR Longest period of sobriety (when/how long): Unknown Name of Substance 1: Cocaine 1 - Age of First Use: 32 1 - Amount (size/oz): $60 - $100 1 - Frequency: Every two weeks 1 - Duration: Unknown 1 - Last Use / Amount: Saturday (09/30/2019) Name of Substance 2: Marijuana 2 - Age of First Use: 17 2 - Amount (size/oz): 1 gram 2 - Frequency: Daily 2 - Duration: Unknown 2 - Last Use / Amount: Today (10/01/2019) Name of Substance 3:  EtOH 3 - Age of First Use: 17 3 - Amount (size/oz): 1 bottle of wine 3 - Frequency: 1x/month 3 - Duration: Unknown 3 - Last Use / Amount: Today (10/01/2019)  Sleep: Good  Appetite:  Fair  Current Medications: Current Facility-Administered Medications  Medication Dose Route Frequency Provider Last Rate Last Admin  . acetaminophen (TYLENOL) tablet 650 mg  650 mg Oral Q6H PRN Anike, Adaku C, NP   650 mg at 10/02/19 2154  . alum & mag hydroxide-simeth (MAALOX/MYLANTA) 200-200-20 MG/5ML suspension 30 mL  30 mL Oral Q4H PRN Anike, Adaku C, NP      . baclofen (LIORESAL) tablet 5 mg  5 mg Oral TID Cobos, Myer Peer, MD   5 mg at 10/03/19 0736  . escitalopram (LEXAPRO) tablet 10 mg  10 mg Oral Daily Sharma Covert, MD   10 mg at 10/03/19 0737  . folic acid (FOLVITE) tablet 1 mg  1 mg Oral Daily Sharma Covert, MD   1 mg at 10/03/19 0736  . hydrOXYzine (ATARAX/VISTARIL) tablet 25 mg  25 mg Oral TID PRN Anike, Adaku C, NP   25 mg at 10/02/19 2154  . LORazepam (ATIVAN) tablet 1 mg  1 mg Oral Q6H PRN Sharma Covert, MD      . magnesium hydroxide (MILK OF MAGNESIA) suspension 30 mL  30 mL Oral Daily PRN Anike, Adaku C, NP      . meloxicam (MOBIC) tablet 7.5 mg  7.5 mg Oral Daily Sharma Covert, MD   7.5 mg at 10/03/19 0737  . pantoprazole (PROTONIX) EC tablet 40 mg  40 mg Oral Daily Sharma Covert, MD   40 mg at 10/03/19 0736  . thiamine tablet 100 mg  100 mg Oral Daily Sharma Covert, MD   100 mg at 10/03/19 0737  . traZODone (DESYREL) tablet 50 mg  50 mg Oral QHS PRN Anike, Adaku C, NP       Lab Results:  Results for orders placed or performed during the hospital encounter of 10/01/19 (from the past 48 hour(s))  Respiratory Panel by RT PCR (Flu A&B, Covid) - Nasopharyngeal Swab     Status: None   Collection Time: 10/02/19 12:13 AM   Specimen: Nasopharyngeal Swab  Result Value Ref Range   SARS Coronavirus 2 by RT PCR NEGATIVE NEGATIVE    Comment: (NOTE) SARS-CoV-2 target nucleic acids are NOT DETECTED. The SARS-CoV-2 RNA is generally detectable in upper respiratoy specimens during the acute phase of infection. The lowest concentration of SARS-CoV-2 viral copies this assay can detect is 131 copies/mL. A negative result does not preclude SARS-Cov-2 infection and should not be used as the sole basis for treatment or other patient management decisions. A negative result may occur with  improper specimen collection/handling, submission of specimen other than nasopharyngeal swab, presence of viral mutation(s) within the areas targeted by this assay, and inadequate number of viral copies (<131 copies/mL). A negative result must be combined with clinical observations, patient history, and epidemiological information. The expected result is Negative. Fact Sheet for Patients:  PinkCheek.be Fact Sheet for Healthcare Providers:  GravelBags.it This test is not yet ap proved or cleared by the [place] FDA and  has been authorized for detection and/or diagnosis of SARS-CoV-2 by FDA under an Emergency Use Authorization (EUA). This EUA will remain  in effect (meaning this test can be used) for the duration of the COVID-19 declaration under Section 564(b)(1) of the Act, 21 U.S.C. section 360bbb-3(b)(1), unless  the authorization is terminated or revoked sooner.    Influenza A by PCR NEGATIVE NEGATIVE    Influenza B by PCR NEGATIVE NEGATIVE    Comment: (NOTE) The Xpert Xpress SARS-CoV-2/FLU/RSV assay is intended as an aid in  the diagnosis of influenza from Nasopharyngeal swab specimens and  should not be used as a sole basis for treatment. Nasal washings and  aspirates are unacceptable for Xpert Xpress SARS-CoV-2/FLU/RSV  testing. Fact Sheet for Patients: PinkCheek.be Fact Sheet for Healthcare Providers: GravelBags.it This test is not yet approved or cleared by the [place] FDA and  has been authorized for detection and/or diagnosis of SARS-CoV-2 by  FDA under an Emergency Use Authorization (EUA). This EUA will remain  in effect (meaning this test can be used) for the duration of the  Covid-19 declaration under Section 564(b)(1) of the Act, 21  U.S.C. section 360bbb-3(b)(1), unless the authorization is  terminated or revoked. Performed at [facility], [place] [address] [place], [place] [address]   Comprehensive metabolic panel     Status: None   Collection Time: 10/02/19  7:01 AM  Result Value Ref Range   Sodium 139 135 - 145 mmol/L   Potassium 3.7 3.5 - 5.1 mmol/L   Chloride 108 98 - 111 mmol/L   CO2 22 22 - 32 mmol/L   Glucose, Bld 93 70 - 99 mg/dL    Comment: Glucose reference range applies only to samples taken after fasting for at least 8 hours.   BUN 7 6 - 20 mg/dL   Creatinine, Ser 0.84 0.44 - 1.00 mg/dL   Calcium 9.4 8.9 - 10.3 mg/dL   Total Protein 7.3 6.5 - 8.1 g/dL   Albumin 4.6 3.5 - 5.0 g/dL   AST 21 15 - 41 U/L   ALT 19 0 - 44 U/L   Alkaline Phosphatase 70 38 - 126 U/L   Total Bilirubin 0.9 0.3 - 1.2 mg/dL   GFR calc non Af Amer >60 >60 mL/min   GFR calc Af Amer >60 >60 mL/min   Anion gap 9 5 - 15    Comment: Performed at [facility], [place] [address] [place], [place] [address]  Hemoglobin A1c     Status: None   Collection Time: 10/02/19  7:01 AM  Result Value  Ref Range   Hgb A1c MFr Bld 4.9 4.8 - 5.6 %    Comment: (NOTE) Pre diabetes:          5.7%-6.4% Diabetes:              >6.4% Glycemic control for   <7.0% adults with diabetes    Mean Plasma Glucose 93.93 mg/dL    Comment: Performed at [place] [address], [place], [place] [address]  Magnesium     Status: Abnormal   Collection Time: 10/02/19  7:01 AM  Result Value Ref Range   Magnesium 2.5 (H) 1.7 - 2.4 mg/dL    Comment: Performed at [facility], [place] [address], [place], [place] [address]  Ethanol     Status: None   Collection Time: 10/02/19  7:01 AM  Result Value Ref Range   Alcohol, Ethyl (B) <10 <10 mg/dL    Comment: (NOTE) Lowest detectable limit for serum alcohol is 10 mg/dL. For medical purposes only. Performed at [facility], [place] [address] [place], [place] [address]   Lipid panel     Status: None   Collection Time: 10/02/19  7:01  AM  Result Value Ref Range   Cholesterol 184 0 - 200 mg/dL   Triglycerides 81 <150 mg/dL   HDL 54 >40 mg/dL   Total CHOL/HDL Ratio 3.4 RATIO   VLDL 16 0 - 40 mg/dL   LDL Cholesterol NOT CALCULATED 0 - 99 mg/dL    Comment: Performed at [facility], [place] [address], [place], [place] [address]  Hepatic function panel     Status: None   Collection Time: 10/02/19  7:01 AM  Result Value Ref Range   Total Protein 7.4 6.5 - 8.1 g/dL   Albumin 4.5 3.5 - 5.0 g/dL   AST 22 15 - 41 U/L   ALT 17 0 - 44 U/L   Alkaline Phosphatase 69 38 - 126 U/L   Total Bilirubin 0.7 0.3 - 1.2 mg/dL   Bilirubin, Direct 0.2 0.0 - 0.2 mg/dL   Indirect Bilirubin 0.5 0.3 - 0.9 mg/dL    Comment: Performed at [facility], [place] [address] [place], [place] [address]  TSH     Status: None   Collection Time: 10/02/19  7:01 AM  Result Value Ref Range   TSH 1.646 0.350 - 4.500 uIU/mL    Comment: Performed by a 3rd Generation assay with a functional sensitivity of <=0.01  uIU/mL. Performed at [facility], [place] [address], [place], [place] [address]   Urinalysis, Complete w Microscopic     Status: Abnormal   Collection Time: 10/02/19  1:08 PM  Result Value Ref Range   Color, Urine YELLOW YELLOW   APPearance CLEAR CLEAR   Specific Gravity, Urine 1.017 1.005 - 1.030   pH 7.0 5.0 - 8.0   Glucose, UA NEGATIVE NEGATIVE mg/dL   Hgb urine dipstick NEGATIVE NEGATIVE   Bilirubin Urine NEGATIVE NEGATIVE   Ketones, ur NEGATIVE NEGATIVE mg/dL   Protein, ur 30 (A) NEGATIVE mg/dL   Nitrite NEGATIVE NEGATIVE   Leukocytes,Ua TRACE (A) NEGATIVE   RBC / HPF 0-5 0 - 5 RBC/hpf   WBC, UA 0-5 0 - 5 WBC/hpf   Bacteria, UA NONE SEEN NONE SEEN   Squamous Epithelial / LPF 0-5 0 - 5   Mucus PRESENT     Comment: Performed at [facility], [place] [address] [place],  [address]  Urine rapid drug screen (hosp performed)not at [facility]     Status: Abnormal   Collection Time: 10/02/19  1:08 PM  Result Value Ref Range   Opiates NONE DETECTED NONE DETECTED   Cocaine POSITIVE (A) NONE DETECTED   Benzodiazepines NONE DETECTED NONE DETECTED   Amphetamines NONE DETECTED NONE DETECTED   Tetrahydrocannabinol POSITIVE (A) NONE DETECTED   Barbiturates NONE DETECTED NONE DETECTED    Comment: (NOTE) DRUG SCREEN FOR MEDICAL PURPOSES ONLY.  IF CONFIRMATION IS NEEDED FOR ANY PURPOSE, NOTIFY LAB WITHIN 5 DAYS. LOWEST DETECTABLE LIMITS FOR URINE DRUG SCREEN Drug Class                     Cutoff (ng/mL) Amphetamine and metabolites    1000 Barbiturate and metabolites    200 Benzodiazepine                 939 Tricyclics and metabolites     300 Opiates and metabolites        300 Cocaine and metabolites        300 THC  50 Performed at [facility], [place] [address], [place], [place] [address]   hCG, quantitative, pregnancy     Status: None   Collection Time: 10/02/19  6:20 PM  Result Value Ref Range   hCG,  Beta Chain, Quant, S <1 <5 mIU/mL    Comment:          GEST. AGE      CONC.  (mIU/mL)   <=1 WEEK        5 - 50     2 WEEKS       50 - 500     3 WEEKS       100 - 10,000     4 WEEKS     1,000 - 30,000     5 WEEKS     3,500 - 115,000   6-8 WEEKS     12,000 - 270,000    12 WEEKS     15,000 - 220,000        FEMALE AND NON-PREGNANT FEMALE:     LESS THAN 5 mIU/mL Performed at [facility] [place] [address], [place], [place] [address]    Blood Alcohol level:  Lab Results  Component Value Date   ETH <10 18/56/3149   Metabolic Disorder Labs: Lab Results  Component Value Date   HGBA1C 4.9 10/02/2019   MPG 93.93 10/02/2019   No results found for: PROLACTIN Lab Results  Component Value Date   CHOL 184 10/02/2019   TRIG 81 10/02/2019   HDL 54 10/02/2019   CHOLHDL 3.4 10/02/2019   VLDL 16 10/02/2019   LDLCALC NOT CALCULATED 10/02/2019   Physical Findings:  AIMS: Facial and Oral Movements Muscles of Facial Expression: None, normal Lips and Perioral Area: None, normal Jaw: None, normal Tongue: None, normal,Extremity Movements Upper (arms, wrists, hands, fingers): None, normal Lower (legs, knees, ankles, toes): None, normal, Trunk Movements Neck, shoulders, hips: None, normal, Overall Severity Severity of abnormal movements (highest score from questions above): None, normal Incapacitation due to abnormal movements: None, normal Patient's awareness of abnormal movements (rate only patient's report): No Awareness, Dental Status Current problems with teeth and/or dentures?: No Does patient usually wear dentures?: No  CIWA:  CIWA-Ar Total: 0 COWS:  COWS Total Score: 1  Musculoskeletal: Strength & Muscle Tone: within normal limits Gait & Station: normal Patient leans: N/A  Psychiatric Specialty Exam: Physical Exam  Nursing note and vitals reviewed. Constitutional: She is oriented to person, place, and time. She appears well-developed.  Cardiovascular: Normal  rate.  Respiratory: Effort normal.  Genitourinary:    Genitourinary Comments: Deferred   Musculoskeletal:        General: Normal range of motion.     Cervical back: Normal range of motion.  Neurological: She is alert and oriented to person, place, and time.  Skin: Skin is warm and dry.    Review of Systems  Constitutional: Negative for chills, diaphoresis and fever.  HENT: Negative for congestion, rhinorrhea, sneezing and sore throat.   Respiratory: Negative for cough, shortness of breath and wheezing.   Cardiovascular: Negative for chest pain and palpitations.  Gastrointestinal: Negative for diarrhea and nausea.  Genitourinary: Negative for difficulty urinating.  Musculoskeletal: Negative for arthralgias and myalgias.  Skin: Negative for color change.  Allergic/Immunologic: Negative for environmental allergies and food allergies.       Allergies: Sulfa  Neurological: Negative for dizziness and headaches.    Blood pressure 128/81, pulse (!) 102, temperature 98.2 F (36.8 C), temperature source Oral,  resp. rate 20, SpO2 100 %.There is no height or weight on file to calculate BMI.  General Appearance: Casual  Eye Contact:  Fair  Speech:  Normal Rate  Volume:  Normal  Mood: "I'm feeling a lot better today".  Affect:  Congruent  Thought Process:  Coherent and Descriptions of Associations: Circumstantial  Orientation:  Full (Time, Place, and Person)  Thought Content:  Logical  Suicidal Thoughts:  No  Homicidal Thoughts:  No  Memory:  Immediate;   Fair Recent;   Fair Remote;   Fair  Judgement:  Impaired  Insight:  Lacking  Psychomotor Activity:  Normal  Concentration:  Concentration: Fair and Attention Span: Fair  Recall:  [employer] of Knowledge:  Good  Language:  Good  Akathisia:  Negative  Handed:  Right  AIMS (if indicated):     Assets:  Desire for Improvement Resilience  ADL's:  Intact  Cognition:  WNL    Sleep:  Number of Hours: 6.75   Treatment Plan  Summary: Daily contact with patient to assess and evaluate symptoms and progress in treatment and Medication management.  - Continue inpatient hospitalization.  - Will continue today 10/03/2019 plan as below except where it is noted.  Depression.     - Continue Lexapro 10 mg po daily.     - Initiated Mirtazapine 15 mg po Q hs for depression/insomnia.  Anxiety.      - Continue Hydroxyzine 25 mg po tid prn.      - Continue Lorazepam 1 mg po Q 6 hrs prn for CIWA >10.  Other medical issues.      - Continue Baclofen 5 mg po tid.      - Continue Mobic 7.5 po daily for pain management.      - Continue Protonix 40 mg po Q am for GERD.      -Continue Thiamine 100 mg po daily for thiamine replacement.  - Encourage group participations. - Discharge disposition plan is ongoing.  Lindell Spar, NP, PMHNP, FNP-BC 10/03/2019, 9:54 AM

## 2019-10-03 NOTE — B[facility] Group Notes (Signed)
Balance In Life 10/03/2019 1PM  Type of Therapy/Topic:  Group Therapy:  Balance in Life  Participation Level:  Did Not Attend  Description of Group:   This group will address the concept of balance and how it feels and looks when one is unbalanced. Patients will be encouraged to process areas in their lives that are out of balance and identify reasons for remaining unbalanced. Facilitators will guide patients in utilizing problem-solving interventions to address and correct the stressor making their life unbalanced. Understanding and applying boundaries will be explored and addressed for obtaining and maintaining a balanced life. Patients will be encouraged to explore ways to assertively make their unbalanced needs known to significant others in their lives, using other group members and facilitator for support and feedback.  Therapeutic Goals: 1. Patient will identify two or more emotions or situations they have that consume much of in their lives. 2. Patient will identify signs/triggers that life has become out of balance:  3. Patient will identify two ways to set boundaries in order to achieve balance in their lives:  4. Patient will demonstrate ability to communicate their needs through discussion and/or role plays  Summary of Patient Progress:    Therapeutic Modalities:   Cognitive Behavioral Therapy Solution-Focused Therapy Assertiveness Training  Anna Vanness T Arelyn Gauer, LCSW  

## 2019-10-03 NOTE — Progress Notes (Signed)
   10/03/19 2300  Psych Admission Type (Psych Patients Only)  Admission Status Involuntary  Psychosocial Assessment  Patient Complaints Anxiety;Irritability  Eye Contact Fair  Facial Expression Animated  Affect Anxious  Speech Logical/coherent  Interaction Assertive  Motor Activity Slow  Appearance/Hygiene Unremarkable  Behavior Characteristics Anxious  Mood Anxious  Aggressive Behavior  Effect No apparent injury  Thought Process  Coherency WDL  Content WDL  Delusions WDL  Perception WDL  Hallucination None reported or observed  Judgment Impaired  Confusion None  Danger to Self  Current suicidal ideation? Denies  Self-Injurious Behavior No self-injurious ideation or behavior indicators observed or expressed   Agreement Not to Harm Self Yes  Description of Agreement  (verbally contracts for safety)  Danger to Others  Danger to Others None reported or observed   Pt was given Remeron , but pt continued to be anxious about going to sleep. Pt requested melatonin, pt encouraged to talk to the doctor

## 2019-10-04 MED ORDER — MELOXICAM 15 MG PO TABS: 15.0000 mg | ORAL_TABLET | Freq: Every day | ORAL | 0 refills | Status: AC

## 2019-10-04 MED ORDER — PANTOPRAZOLE SODIUM 40 MG PO TBEC: 40.0000 mg | DELAYED_RELEASE_TABLET | Freq: Every day | ORAL | 0 refills | Status: AC

## 2019-10-04 MED ORDER — RAMELTEON 8 MG PO TABS: 8.0000 mg | ORAL_TABLET | Freq: Every day | ORAL | 0 refills | Status: DC

## 2019-10-04 MED ORDER — MIRTAZAPINE 15 MG PO TABS: 15.0000 mg | ORAL_TABLET | Freq: Every day | ORAL | 0 refills | Status: DC

## 2019-10-04 MED ORDER — RAMELTEON 8 MG PO TABS
8.0000 mg | ORAL_TABLET | Freq: Every day | ORAL | Status: DC
  Filled 2019-10-04 (×2): qty 1

## 2019-10-04 MED ORDER — ESCITALOPRAM OXALATE 10 MG PO TABS: 10.0000 mg | ORAL_TABLET | Freq: Every day | ORAL | 0 refills | Status: DC

## 2019-10-04 MED ORDER — BACLOFEN 5 MG PO TABS: 5.0000 mg | ORAL_TABLET | Freq: Three times a day (TID) | ORAL | 0 refills | Status: AC

## 2019-10-04 MED ORDER — MELOXICAM 15 MG PO TABS
15.0000 mg | ORAL_TABLET | Freq: Every day | ORAL | Status: DC
  Filled 2019-10-04 (×2): qty 1

## 2019-10-04 MED ORDER — HYDROXYZINE HCL 25 MG PO TABS: 25.0000 mg | ORAL_TABLET | Freq: Three times a day (TID) | ORAL | 0 refills | Status: DC | PRN

## 2019-10-04 NOTE — [facility] Suicide Risk Assessment (Signed)
[facility] Discharge Suicide Risk Assessment   Principal Problem: MDD (major depressive disorder), recurrent episode, severe (HCC) Discharge Diagnoses: Principal Problem:   MDD (major depressive disorder), recurrent episode, severe (HCC)   Total Time spent with patient: 20 minutes  Musculoskeletal: Strength & Muscle Tone: within normal limits Gait & Station: normal Patient leans: N/A  Psychiatric Specialty Exam: Review of Systems  Musculoskeletal: Positive for arthralgias, back pain, myalgias, neck pain and neck stiffness.  All other systems reviewed and are negative.   Blood pressure 117/80, pulse 86, temperature 98 F (36.7 C), temperature source Oral, resp. rate 20, SpO2 100 %.There is no height or weight on file to calculate BMI.  General Appearance: Casual  Eye Contact::  Good  Speech:  Normal Rate409  Volume:  Normal  Mood:  Euthymic  Affect:  Congruent  Thought Process:  Coherent and Descriptions of Associations: Intact  Orientation:  Full (Time, Place, and Person)  Thought Content:  Logical  Suicidal Thoughts:  No  Homicidal Thoughts:  No  Memory:  Immediate;   Good Recent;   Good Remote;   Good  Judgement:  Intact  Insight:  Fair  Psychomotor Activity:  Normal  Concentration:  Good  Recall:  Good  Fund of Knowledge:Good  Language: Good  Akathisia:  Negative  Handed:  Right  AIMS (if indicated):     Assets:  Communication Skills Desire for Improvement Housing Resilience Talents/Skills  Sleep:  Number of Hours: 5.25  Cognition: WNL  ADL's:  Intact   Mental Status Per Nursing Assessment::   On Admission:  Suicidal ideation indicated by patient, Suicidal ideation indicated by others, Self-harm thoughts  Demographic Factors:  Low socioeconomic status and Unemployed  Loss Factors: NA  Historical Factors: Impulsivity  Risk Reduction Factors:   Sense of responsibility to family, Living with another person, especially a relative and Positive social  support  Continued Clinical Symptoms:  Depression:   Impulsivity Alcohol/Substance Abuse/Dependencies  Cognitive Features That Contribute To Risk:  None    Suicide Risk:  Minimal: No identifiable suicidal ideation.  Patients presenting with no risk factors but with morbid ruminations; may be classified as minimal risk based on the severity of the depressive symptoms  Follow-up Information    [facility] Call.   Why: Please call for an appointment with Dr. Twanna Hy for therapy and medication management.  Contact information: [address] [facility] [place] [address] [phone]           Plan Of Care/Follow-up recommendations:  Activity:  ad lib  Antonieta Pert, MD 10/04/2019, 8:49 AM

## 2019-10-04 NOTE — Progress Notes (Signed)
D:  Patient's self inventory sheet, patient has poor sleep, sleep medication not helpful.  Fair appetite, normal energy level, good concentration.  Rated depression, hopeless and anxiety 5.  Denied withdrawals.  Denied SI.  Physical problems, pain, worst pain #$, neck, back, shoulders, knees, ankles, feet.  Pain medication helpful.  Goal is discharge,   Plans to include family in her treatment.  Her family needs support.  No discharge plans. A:  Medications administered per MD orders.  Emotional support and encouragement given patient. R:   Denied SI and HI, contracts for safety.  Denied A/V hallucinations.  Safety maintained with 15 minute checks.
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
## 2019-10-04 NOTE — Progress Notes (Signed)
Called [place] to get an appointment as she is discharging today.  Was finally able to get the voicemail of her physician, Dr. Francie Massing.  Left an urgent voicemail with my contact information and the patient's contact information, requesting an appointment.

## 2019-10-04 NOTE — Progress Notes (Signed)
Called [place] on 10/02/19 and finally spoke to someone who said they would call me the following day with an appointment for the patient.  They did not call back and I tried several times to get through and left a couple of urgent voicemails.  I also faxed a request for an appointment on 10/03/19.  No response as of 10/04/19.

## 2019-10-04 NOTE — Progress Notes (Signed)
  [facility] Adult Case Management Discharge Plan :  Will you be returning to the same living situation after discharge:  Yes,  patient is returning home with her sister At discharge, do you have transportation home?: Yes,  patient's sister and mother is picking her up Do you have the ability to pay for your medications: Yes,  [employer]  Release of information consent forms completed and in the chart;  Patient's signature needed at discharge.  Patient to Follow up at: Follow-up Information    [facility] Go on 11/09/2019.   Why: Your appointment with Dr. Twanna Hy for medication management is Friday, 11/09/19 at 11:00am. Your therapy appointment will be Friday, 11/16/19 at 11:00am. These appointments will be held virtually. Be sure to present you discharge paperwork.  Contact information: [address] [facility] [place] [address] [phone]           Next level of care provider has access to [facility] Link:yes  Safety Planning and Suicide Prevention discussed: Yes,  with mother     Has patient been referred to the Quitline?: Patient refused referral  Patient has been referred for addiction treatment: Pt. refused referral  Maeola Sarah, LCSWA 10/04/2019, 10:48 AM

## 2019-10-04 NOTE — Discharge Instructions (Signed)
Both of your appointments will be held virtually. Receptionist will send a WebEx/Zoom link to your email. Be sure to call [facility] for any additional questions or concerns.

## 2019-10-04 NOTE — Progress Notes (Signed)
Discharge Note:  Patient discharged.  Denied SI and HI.  Denied A/V hallucinations.  Denied pain.  Suicide prevention information given and discussed with patient who stated she understood and had no questions.  Patient stated she received all her belongings, clothing, toiletries, misc items, etc.  Patient stated she appreciated all assistance received from [facility] staff.  All required discharge information given to patient at discharge.

## 2019-10-12 ENCOUNTER — Other Ambulatory Visit: Payer: Self-pay

## 2019-10-12 ENCOUNTER — Encounter: Payer: Self-pay | Admitting: Cardiology

## 2019-10-12 ENCOUNTER — Ambulatory Visit: Payer: 59 | Admitting: Cardiology

## 2019-10-12 VITALS — BP 130/90 | HR 79 | Ht 67.0 in | Wt 185.0 lb

## 2019-10-12 DIAGNOSIS — F17210 Nicotine dependence, cigarettes, uncomplicated: Secondary | ICD-10-CM

## 2019-10-12 DIAGNOSIS — R55 Syncope and collapse: Secondary | ICD-10-CM | POA: Diagnosis not present

## 2019-10-12 HISTORY — DX: Nicotine dependence, cigarettes, uncomplicated: F17.210

## 2019-10-12 NOTE — Patient Instructions (Signed)

## 2019-10-12 NOTE — Progress Notes (Signed)
Cardiology Office Note:    Date:  10/12/2019   ID:  Anna Bass, DOB Jan 13, 1986, MRN [id number]  PCP:  Patient, No Pcp Per  Cardiologist:  Jenean Lindau, MD   Referring MD: Ria Bush, MD    ASSESSMENT:    1. Syncope, unspecified syncope type   2. Cigarette smoker    PLAN:    In order of problems listed above:  1. Syncope: The symptoms have not recurred.  Patient has not had a syncope for over a year and happy about it.  She keeps herself well-hydrated.  She is eating well.  She is suffering from depression but tells me that she has improved significantly and pain particular attention to diet and keeping herself well-hydrated.  I encouraged her to continue this.  She has not had any issues with dizzy spells or lightheadedness. 2. Cigarette smoker: I spent 5 minutes with the patient discussing solely about smoking. Smoking cessation was counseled. I suggested to the patient also different medications and pharmacological interventions. Patient is keen to try stopping on its own at this time. He will get back to me if he needs any further assistance in this matter. 3. Patient will be seen in follow-up appointment in 6 months or earlier if the patient has any concerns    Medication Adjustments/Labs and Tests Ordered: Current medicines are reviewed at length with the patient today.  Concerns regarding medicines are outlined above.  Orders Placed This Encounter  Procedures  . EKG 12-Lead   No orders of the defined types were placed in this encounter.    Chief Complaint  Patient presents with  . Follow-up     History of Present Illness:    Anna Bass is a 34 y.o. female.  Patient has past medical history of syncope for which reason she was evaluated by me.  She says she has not had any such problems at this time.  No chest pain orthopnea or PND.  She takes care of activities of daily living.  She tells me that she had to deal with depression in between  and subsequently has gotten better.  Unfortunately she has gone back to smoking and is not happy about it.  At the time of my evaluation, the patient is alert awake oriented and in no distress.  Past Medical History:  Diagnosis Date  . BRCA negative 11/2018   MyRisk neg  . Depression   . Family history of breast cancer   . Frequent headaches   . Increased risk of breast cancer 11/2018   IBIS=29.2%  . Obesity, Class I, BMI 30-34.9 09/25/2015   resolved  . Smoker 09/25/2015    Past Surgical History:  Procedure Laterality Date  . CHALAZION EXCISION      Current Medications: Current Meds  Medication Sig  . baclofen 5 MG TABS Take 5 mg by mouth 3 (three) times daily. For muscle spasms  . escitalopram (LEXAPRO) 10 MG tablet Take 1 tablet (10 mg total) by mouth daily. For depression  . hydrOXYzine (ATARAX/VISTARIL) 25 MG tablet Take 1 tablet (25 mg total) by mouth 3 (three) times daily as needed for anxiety.  Marland Kitchen ibuprofen (ADVIL) 800 MG tablet Take 800 mg by mouth 3 (three) times daily.  . meloxicam (MOBIC) 15 MG tablet Take 1 tablet (15 mg total) by mouth daily. For arthritic pain  . mirtazapine (REMERON) 15 MG tablet Take 1 tablet (15 mg total) by mouth at bedtime. For depression/sleep  . pantoprazole (PROTONIX) 40 MG tablet  Take 1 tablet (40 mg total) by mouth daily. For acid reflux  . ramelteon (ROZEREM) 8 MG tablet Take 1 tablet (8 mg total) by mouth at bedtime. For sleep     Allergies:   Sulfa antibiotics   Social History   Socioeconomic History  . Marital status: Single    Spouse name: Not on file  . Number of children: Not on file  . Years of education: Not on file  . Highest education level: Not on file  Occupational History  . Not on file  Tobacco Use  . Smoking status: Former Smoker    Packs/day: 0.50    Years: 10.00    Pack years: 5.00    Types: Cigarettes    Start date: 07/26/2006    Quit date: 08/02/2018    Years since quitting: 1.1  . Smokeless tobacco: Never  Used  Substance and Sexual Activity  . Alcohol use: Yes    Alcohol/week: 0.0 standard drinks    Comment: occasional  . Drug use: No    Comment: prior MJ use  . Sexual activity: Yes    Birth control/protection: Condom  Other Topics Concern  . Not on file  Social History Narrative   Lives with cousin, son and mother   Edu: college - nursing --> communications degree   Occupation: Chief Strategy Officer: [employer]   Activity: no regular exercise   Diet: fruits/vegetables daily, some water   Social Determinants of Radio broadcast assistant Strain:   . Difficulty of Paying Living Expenses:   Food Insecurity:   . Worried About Charity fundraiser in the Last Year:   . Arboriculturist in the Last Year:   Transportation Needs:   . Film/video editor (Medical):   Marland Kitchen Lack of Transportation (Non-Medical):   Physical Activity:   . Days of Exercise per Week:   . Minutes of Exercise per Session:   Stress:   . Feeling of Stress :   Social Connections:   . Frequency of Communication with Friends and Family:   . Frequency of Social Gatherings with Friends and Family:   . Attends Religious Services:   . Active Member of Clubs or Organizations:   . Attends Archivist Meetings:   Marland Kitchen Marital Status:      Family History: The patient's family history includes Brain cancer (age of onset: 96) in her paternal grandmother; Breast cancer (age of onset: 60) in her paternal grandmother; Cancer (age of onset: 11) in her mother; Cancer (age of onset: 97) in her maternal grandmother; Heart disease in her maternal grandfather; Leukemia (age of onset: 61) in her mother; Ovarian cysts in her sister; Prostate cancer (age of onset: 58) in her paternal grandfather; Stroke in her maternal grandfather. There is no history of Diabetes or Ovarian cancer.  ROS:   Please see the history of present illness.    All other systems reviewed and are negative.  EKGs/Labs/Other Studies Reviewed:    The  following studies were reviewed today: I reviewed blood work that was done recently.   Recent Labs: 10/02/2019: ALT 19; ALT 17; BUN 7; Creatinine, Ser 0.84; Magnesium 2.5; Potassium 3.7; Sodium 139; TSH 1.646  Recent Lipid Panel    Component Value Date/Time   CHOL 184 10/02/2019 0701   TRIG 81 10/02/2019 0701   HDL 54 10/02/2019 0701   CHOLHDL 3.4 10/02/2019 0701   VLDL 16 10/02/2019 0701   LDLCALC NOT CALCULATED 10/02/2019 0701  Physical Exam:    VS:  BP 130/90   Pulse 79   Ht '5\' 7"'$  (1.702 m)   Wt 185 lb (83.9 kg)   SpO2 97%   BMI 28.98 kg/m     Wt Readings from Last 3 Encounters:  10/12/19 185 lb (83.9 kg)  11/21/18 137 lb (62.1 kg)  11/21/18 137 lb (62.1 kg)     GEN: Patient is in no acute distress HEENT: Normal NECK: No JVD; No carotid bruits LYMPHATICS: No lymphadenopathy CARDIAC: Hear sounds regular, 2/6 systolic murmur at the apex. RESPIRATORY:  Clear to auscultation without rales, wheezing or rhonchi  ABDOMEN: Soft, non-tender, non-distended MUSCULOSKELETAL:  No edema; No deformity  SKIN: Warm and dry NEUROLOGIC:  Alert and oriented x 3 PSYCHIATRIC:  Normal affect   Signed, Jenean Lindau, MD  10/12/2019 2:23 PM    [place]

## 2019-10-15 ENCOUNTER — Encounter (HOSPITAL_COMMUNITY): Payer: Self-pay

## 2019-10-15 DIAGNOSIS — F320 Major depressive disorder, single episode, mild: Secondary | ICD-10-CM

## 2019-10-15 NOTE — BH Assessment (Signed)
Q-Actual   Writer left a voice mail message to follow up with the patient.

## 2019-10-16 ENCOUNTER — Encounter (HOSPITAL_COMMUNITY): Payer: Self-pay

## 2019-10-16 DIAGNOSIS — F320 Major depressive disorder, single episode, mild: Secondary | ICD-10-CM

## 2019-10-16 NOTE — Research (Addendum)
Q-Actual   Patient has an elevated PHQ 2-9 and a "no assistance from team notification".  Patient voice mail was full and writer was not able to leave a voice mail message.

## 2019-10-24 ENCOUNTER — Encounter (HOSPITAL_COMMUNITY): Payer: Self-pay

## 2019-10-24 DIAGNOSIS — F320 Major depressive disorder, single episode, mild: Secondary | ICD-10-CM

## 2019-10-24 NOTE — BH Assessment (Signed)
Q-Actual  ° °Writer attempted to contact the participant in order to complete a PHQ 2-9.  Writer was unsuccessful in reaching the patient and was not able to leave a voice mail message.  Participants voice mailbox is full.  ° °

## 2019-10-28 ENCOUNTER — Encounter (HOSPITAL_COMMUNITY): Payer: Self-pay

## 2019-10-30 ENCOUNTER — Telehealth (HOSPITAL_COMMUNITY): Payer: Self-pay | Admitting: Psychiatry

## 2019-10-30 NOTE — Telephone Encounter (Signed)
D:  Ava S (TTS) referred pt to CD-IOP.  A:  Placed call to orient pt, but there was no answer.  Left vm asking pt to call case manager.  Inform Ava S.

## 2019-10-30 NOTE — BH Assessment (Signed)
Writer provided participant with information to [facility].

## 2019-10-30 NOTE — BH Assessment (Unsigned)
Q-Actual   PHQ 2 is 3 PHQ 9 is 8  Participants reports that she has been to rehab on two separate occassions for substance abuse.  Participant is going to add another support person to her Team.  She is currently on FMLA.  Participant is employed at the [employer]

## 2019-11-14 ENCOUNTER — Encounter (HOSPITAL_COMMUNITY): Payer: Self-pay

## 2019-11-14 DIAGNOSIS — F320 Major depressive disorder, single episode, mild: Secondary | ICD-10-CM

## 2019-11-14 NOTE — BH Assessment (Signed)
Q-Actual   Writer attempted to contact the participant in order to complete a PHQ 2-9.  Writer was unsuccessful in reaching the patient.   Participant left a voice mail message.  Participant last wellness check was on 11-07-2019

## 2019-11-22 ENCOUNTER — Encounter (HOSPITAL_COMMUNITY): Payer: Self-pay

## 2019-11-22 DIAGNOSIS — F332 Major depressive disorder, recurrent severe without psychotic features: Secondary | ICD-10-CM

## 2019-11-22 NOTE — BH Assessment (Signed)
.  Q-Actual   Writer attempted to contact the participant in order to complete a PHQ 2-9.  Writer was unsuccessful in reaching the patient.  Writer left a Recruitment consultant message.     Writer last wellness check was on 11-22-2019.  Participant has been inconsistent with completing the wellness check

## 2019-11-26 ENCOUNTER — Encounter (HOSPITAL_COMMUNITY): Payer: Self-pay

## 2019-11-26 DIAGNOSIS — F332 Major depressive disorder, recurrent severe without psychotic features: Secondary | ICD-10-CM

## 2019-11-26 NOTE — BH Assessment (Signed)
Q-actual 2-wk PHQ 2-9   PHQ 2 is 4  PHQ 9 is 13  Participant reports that one of her friend hung himself yesterday.  Participant [facility]  Participant last wellness check was completed on 11/22/2019. Her son will not pass to the next grade.  He was failing before the pandemic.  Participant denies SI.HI.Psychosis.  Patient denies using any drugs.

## 2019-12-15 ENCOUNTER — Encounter (HOSPITAL_COMMUNITY): Payer: Self-pay

## 2019-12-15 DIAGNOSIS — F332 Major depressive disorder, recurrent severe without psychotic features: Secondary | ICD-10-CM

## 2019-12-15 NOTE — BH Assessment (Signed)
Q-Actual   Writer attempted to contact the participant in order to complete a PHQ 2-9.  Writer was unsuccessful in reaching the patient.  Writer left the participant a voice mail message.   The last Wellness Check was on 11-22-2019.

## 2019-12-19 ENCOUNTER — Encounter (HOSPITAL_COMMUNITY): Payer: Self-pay

## 2019-12-19 DIAGNOSIS — F332 Major depressive disorder, recurrent severe without psychotic features: Secondary | ICD-10-CM

## 2019-12-19 NOTE — BH Assessment (Signed)
Q-Actual   Writer attempted to contact the participant in order to complete a PHQ 2-9.  Writer left a message.

## 2019-12-24 ENCOUNTER — Encounter (HOSPITAL_COMMUNITY): Payer: Self-pay

## 2019-12-24 DIAGNOSIS — F332 Major depressive disorder, recurrent severe without psychotic features: Secondary | ICD-10-CM

## 2019-12-25 NOTE — BH Assessment (Signed)
Q-Actual   Writer attempted to contact the participant in order to complete a PHQ 2-9.  Writer left a voice mail message.  

## 2020-01-19 ENCOUNTER — Emergency Department (HOSPITAL_COMMUNITY): Admission: EM | Admit: 2020-01-19 | Discharge: 2020-01-19 | Discharge status: Against Medical Advice | Payer: 59

## 2020-01-19 ENCOUNTER — Other Ambulatory Visit: Payer: Self-pay

## 2020-01-19 NOTE — ED Notes (Signed)
Pt states she has no complaints. Does not want to be triaged.

## 2020-01-21 ENCOUNTER — Encounter (HOSPITAL_COMMUNITY): Payer: Self-pay

## 2020-01-21 DIAGNOSIS — F332 Major depressive disorder, recurrent severe without psychotic features: Secondary | ICD-10-CM

## 2020-01-21 NOTE — BH Assessment (Signed)
Writer scheduled an appointment with Dr. Hinton Dyer on 01-23-2020 at 1pm

## 2020-01-21 NOTE — BH Assessment (Signed)
Q-actual 2-wk PHQ 2-9   PHQ 2 is 5  PHQ 9 is 12  Participant reports that she has npt been completing the wellness checks.  Participant reports increased depression without suicidal ideation.  Participant reports that she, "thinks that she needs to go inpatient again"  Writer contacted the non emergency line in order to do a follow up check with the patient.     Non emergency, contacted the writer and reports that the participant, "denies SI,HI.Psychosis".  Participant reports that she no longer wants to go inpatient.  Participant reports that she continues to receive services with th [place]

## 2020-01-23 ENCOUNTER — Other Ambulatory Visit: Payer: Self-pay

## 2020-01-23 ENCOUNTER — Encounter (HOSPITAL_COMMUNITY): Payer: Self-pay | Admitting: Psychiatry

## 2020-01-23 ENCOUNTER — Telehealth (INDEPENDENT_AMBULATORY_CARE_PROVIDER_SITE_OTHER): Payer: 59 | Admitting: Psychiatry

## 2020-01-23 DIAGNOSIS — F1021 Alcohol dependence, in remission: Secondary | ICD-10-CM

## 2020-01-23 DIAGNOSIS — F1421 Cocaine dependence, in remission: Secondary | ICD-10-CM

## 2020-01-23 DIAGNOSIS — F1220 Cannabis dependence, uncomplicated: Secondary | ICD-10-CM

## 2020-01-23 DIAGNOSIS — F331 Major depressive disorder, recurrent, moderate: Secondary | ICD-10-CM

## 2020-01-23 HISTORY — DX: Cocaine dependence, in remission: F14.21

## 2020-01-23 HISTORY — DX: Cannabis dependence, uncomplicated: F12.20

## 2020-01-23 HISTORY — DX: Alcohol dependence, in remission: F10.21

## 2020-01-23 MED ORDER — HYDROXYZINE HCL 25 MG PO TABS: 25.0000 mg | ORAL_TABLET | Freq: Three times a day (TID) | ORAL | 2 refills | Status: AC | PRN

## 2020-01-23 MED ORDER — RAMELTEON 8 MG PO TABS: 8.0000 mg | ORAL_TABLET | Freq: Every day | ORAL | 2 refills | Status: AC

## 2020-01-23 MED ORDER — MIRTAZAPINE 30 MG PO TABS: 30.0000 mg | ORAL_TABLET | Freq: Every day | ORAL | 2 refills | Status: AC
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
## 2020-02-18 ENCOUNTER — Encounter (HOSPITAL_COMMUNITY): Payer: Self-pay | Admitting: General Practice

## 2020-02-18 NOTE — BH Assessment (Signed)
Patient phone is disconnected. 

## 2020-02-20 ENCOUNTER — Telehealth (HOSPITAL_COMMUNITY): Payer: Self-pay | Admitting: Psychiatry

## 2020-02-20 ENCOUNTER — Other Ambulatory Visit: Payer: Self-pay

## 2020-02-24 ENCOUNTER — Encounter (HOSPITAL_COMMUNITY): Payer: Self-pay | Admitting: General Practice

## 2020-02-24 DIAGNOSIS — F331 Major depressive disorder, recurrent, moderate: Secondary | ICD-10-CM

## 2020-02-24 NOTE — BH Assessment (Signed)
Q-Actual   Phone is disconnected.  

## 2020-03-11 ENCOUNTER — Encounter (HOSPITAL_COMMUNITY): Payer: Self-pay | Admitting: General Practice

## 2020-03-11 DIAGNOSIS — F331 Major depressive disorder, recurrent, moderate: Secondary | ICD-10-CM

## 2020-03-11 NOTE — BH Assessment (Signed)
Q-Actual   Phone is disconnected.

## 2020-04-18 ENCOUNTER — Ambulatory Visit: Payer: Self-pay | Admitting: Cardiology

## 2020-04-18 ENCOUNTER — Ambulatory Visit: Payer: 59 | Admitting: Cardiology

## 2020-05-05 DIAGNOSIS — F32A Depression, unspecified: Secondary | ICD-10-CM | POA: Insufficient documentation

## 2020-05-05 DIAGNOSIS — R519 Headache, unspecified: Secondary | ICD-10-CM | POA: Insufficient documentation

## 2020-05-06 ENCOUNTER — Ambulatory Visit: Payer: Self-pay | Admitting: Cardiology

## 2023-02-25 ENCOUNTER — Ambulatory Visit: Admit: 2023-02-25 | Discharge: 2023-02-25 | Payer: PRIVATE HEALTH INSURANCE

## 2023-02-25 DIAGNOSIS — S29019A Strain of muscle and tendon of unspecified wall of thorax, initial encounter: Secondary | ICD-10-CM

## 2023-02-25 MED ORDER — CYCLOBENZAPRINE HCL 10 MG PO TABS
10 MG | ORAL_TABLET | Freq: Three times a day (TID) | ORAL | 0 refills | Status: AC | PRN
Start: 2023-02-25 — End: 2023-03-01

## 2023-02-25 NOTE — Patient Instructions (Addendum)
-   Consider using NSAIDS  (ibuprofen, motrin, aleve, naproxen etc.) 600-800mg  every 8 hours with food for the next 3-4 days and as needed afterwards.    - Tylenol 975mg  or Tylenol extra strength 1000mg  in between NSAIDs for break through pain    - Consider using  OTC lidocaine patches and massaging areas of discomfort with diclofenac cream (Voltaren gel),  Aspercreame or Biofreeze.    - Consider hot bath with epson salt as well as a professional massage once symptoms improve.     - consider using heat/cold therapy 20 minutes on /off 5-6 times daily    - Consider stretching and doing physical therapy exercises (see handout in packet)

## 2023-02-25 NOTE — Progress Notes (Signed)
Wendy Martin (DOB:  September 25, 1985) is a 37 y.o. female,New patient, here for evaluation of the following chief complaint(s):  Back Pain (C/o lower back pain )      ASSESSMENT/PLAN:    1. Thoracic myofascial strain, initial encounter  - cyclobenzaprine (FLEXERIL) 10 MG tablet; Take 1 tablet by mouth 3 times daily as needed for Muscle spasms  Dispense: 12 tablet; Refill: 0    2. Muscle strain of upper back  - cyclobenzaprine (FLEXERIL) 10 MG tablet; Take 1 tablet by mouth 3 times daily as needed for Muscle spasms  Dispense: 12 tablet; Refill: 0  - No cervical, thoracic or lumbar radicular symptoms. Likely muscle strain. Advised rest, stretches, alternate heat with ice, start  ibuprofen/ Tylenol prn, flexeril prn. Return if not improving or worsening. Work note provided.     SUBJECTIVE/OBJECTIVE:  37 y.o. female presents with symptoms of upper to middle back pain x 1 day. Denies otc medications or home treatments. Reports she lifts heavy boxes at work and contributed symptoms to that. Denies recent injuries, falls or back surgeries. Denies urinary or bowel incontinence. Denies chest pain or SOB.     Physical Exam  Vitals reviewed.   Cardiovascular:      Rate and Rhythm: Normal rate.      Pulses: Normal pulses.      Heart sounds: Normal heart sounds.   Pulmonary:      Effort: Pulmonary effort is normal.      Breath sounds: Normal breath sounds.   Abdominal:      Tenderness: There is no right CVA tenderness or left CVA tenderness.   Musculoskeletal:      Cervical back: Normal.      Thoracic back: Spasms (T4-T6) and tenderness present. No swelling, deformity or lacerations.      Lumbar back: Normal.        Back:       Comments: TTP over trapezius muscles of bilateral upper back   Neurological:      Mental Status: She is alert.         Vitals:    02/25/23 1207   BP: 102/64   Site: Left Upper Arm   Position: Sitting   Cuff Size: Medium Adult   Pulse: 69   Resp: 19   Temp: 97.8 F (36.6 C)   SpO2: 98%   Weight: 70.3 kg  (155 lb)       No results found for this visit on 02/25/23.     Allergies   Allergen Reactions    Primaquine Anaphylaxis    Bupropion     Clonazepam     Darunavir     Sulfa Antibiotics        Social History     Socioeconomic History    Marital status: Single     Spouse name: Not on file    Number of children: Not on file    Years of education: Not on file    Highest education level: Not on file   Occupational History    Not on file   Tobacco Use    Smoking status: Never     Passive exposure: Never    Smokeless tobacco: Never   Substance and Sexual Activity    Alcohol use: Never    Drug use: Never    Sexual activity: Not on file   Other Topics Concern    Not on file   Social History Narrative    Not on file     Social  Determinants of Health     Financial Resource Strain: Not on file   Food Insecurity: Not on file   Transportation Needs: Not on file   Physical Activity: Not on file   Stress: Not on file   Social Connections: Not on file   Intimate Partner Violence: Not on file   Housing Stability: Not on file       History reviewed. No pertinent past medical history.    History reviewed. No pertinent surgical history.    Prior to Admission medications    Not on File        Follow up if symptoms persist or if symptoms worsen.    I ADVISED PATIENT TO GO TO ER IF SYMPTOMS WORSEN , CHANGE OR FAILS TO IMPROVE.    I have discussed the diagnosis with the patient and the intended plan as seen in the above orders. The patient has received an after-visit summary and questions were answered concerning future plans. I have discussed medication side effects and warnings with the patient as well. The patient agrees and understands above plan.     An electronic signature was used to authenticate this note.    Shanda Howells, APRN - NP

## 2023-03-18 ENCOUNTER — Ambulatory Visit: Admit: 2023-03-18 | Discharge: 2023-03-18 | Payer: PRIVATE HEALTH INSURANCE

## 2023-03-18 DIAGNOSIS — U071 COVID-19: Secondary | ICD-10-CM

## 2023-03-18 LAB — AMB POC RAPID INFLUENZA TEST: QuickVue Influenza test: NEGATIVE

## 2023-03-18 LAB — POCT COVID-19, SARS-COV-2, PCR
Lot Number: 861293
SARS-COV-2, POC: DETECTED — AB

## 2023-03-18 LAB — AMB POC RAPID STREP A: Group A Strep Antigen, POC: NEGATIVE

## 2023-03-18 NOTE — Patient Instructions (Signed)
Thank you for visiting [facility] today.    Please follow up with your PCP as needed.  -Rest  -Drink plenty of fluids to maintain hydration  -Ibuprofen/Tylenol for pain/fever/body aches    If you begin to have increased shortness of breath or chest pain, please go to the ER.

## 2023-03-18 NOTE — Progress Notes (Signed)
03/18/2023   Wendy Martin (DOB: 1985-08-29) is a 37 y.o. female, New patient, here for evaluation of the following chief complaint(s):  URI (C/o URI issues with sore throat)     ASSESSMENT/PLAN:  Below is the assessment and plan developed based on review of pertinent history, physical exam, labs, studies, and medications.  1. COVID-19  2. Sore throat  -     AMB POC RAPID STREP A  3. Congestion of nasal sinus  -     POCT COVID-19, SARS-COV-2, PCR  -     AMB POC RAPID INFLUENZA TEST       Handout given with care instructions  2. OTC for symptom management. Increase fluid intake, ensure adequate nutritional intake.  3. Follow up with PCP as needed.  4. Go to ED with development of any acute symptoms.     Follow up:  No follow-ups on file.  Follow up immediately for any new, worsening or changes or if symptoms are not improving over the next 5-7 days.     SUBJECTIVE/OBJECTIVE:     Wendy Martin is a 37 y.o. female who complains of congestion, sore throat, post nasal drip, myalgias, headache, and chills for 2 days. She denies a history of chest pain, dizziness, fevers, nausea, shortness of breath, and vomiting and denies a history of asthma.  Patient denies environmental/ seasonal allergies. Patient has exposure to smoke / cigarettes.Patient denies exposure to  sick contacts . Patient also noted that OTC remedies did not help. PROGRESSION: STABLE SYMPTOMS      Diagnoses and all orders for this visit:  Sore throat  -     AMB POC RAPID STREP A  Congestion of nasal sinus  -     POCT COVID-19, SARS-COV-2, PCR  -     AMB POC RAPID INFLUENZA TEST      URI (C/o URI issues with sore throat)      Results for orders placed or performed in visit on 03/18/23   POCT COVID-19, SARS-COV-2, PCR   Result Value Ref Range    SARS-COV-2, POC Detected (A) Not Detected    Lot Number [id number]     QC Pass/Fail pass    AMB POC RAPID INFLUENZA TEST   Result Value Ref Range    Valid Internal Control, POC pass     QuickVue Influenza test  Negative        Results for orders placed or performed in visit on 03/18/23   POCT COVID-19, SARS-COV-2, PCR   Result Value Ref Range    SARS-COV-2, POC Detected (A) Not Detected    Lot Number [id number]     QC Pass/Fail pass    AMB POC RAPID INFLUENZA TEST   Result Value Ref Range    Valid Internal Control, POC pass     QuickVue Influenza test Negative      XR Results (most recent):  @BSHSILASTIMGCAT ([id number])@         Review of Systems   Constitutional:  Positive for chills.   HENT:  Positive for congestion, postnasal drip, rhinorrhea and sore throat.    Eyes: Negative.    Respiratory:  Positive for cough.    Cardiovascular: Negative.    Gastrointestinal:  Positive for diarrhea.   Endocrine: Negative.    Genitourinary: Negative.    Musculoskeletal:  Positive for arthralgias.   Skin: Negative.    Allergic/Immunologic: Negative.    Neurological:  Positive for headaches.   Hematological: Negative.    Psychiatric/Behavioral: Negative.  Physical Exam  Constitutional:       Appearance: She is normal weight.   HENT:      Head: Normocephalic.      Right Ear: Tympanic membrane normal.      Left Ear: Tympanic membrane normal.      Nose: Congestion and rhinorrhea present.      Mouth/Throat:      Mouth: Mucous membranes are moist.      Pharynx: Oropharynx is clear. Posterior oropharyngeal erythema present.   Eyes:      Pupils: Pupils are equal, round, and reactive to light.   Cardiovascular:      Rate and Rhythm: Normal rate and regular rhythm.      Pulses: Normal pulses.      Heart sounds: Normal heart sounds.   Pulmonary:      Effort: Pulmonary effort is normal.      Breath sounds: Normal breath sounds.   Abdominal:      General: Abdomen is flat. Bowel sounds are normal.   Musculoskeletal:         General: Normal range of motion.      Cervical back: Normal range of motion.   Skin:     General: Skin is warm and dry.   Neurological:      General: No focal deficit present.      Mental Status: She is alert.   Psychiatric:          Mood and Affect: Mood normal.         Behavior: Behavior normal.        Vitals:    03/18/23 1251   BP: 106/71   Site: Left Upper Arm   Position: Sitting   Cuff Size: Large Adult   Pulse: 87   Resp: 17   Temp: 98.3 F (36.8 C)   SpO2: 98%   Weight: 68.9 kg (152 lb)        Allergies   Allergen Reactions    Primaquine Anaphylaxis    Bupropion     Clonazepam     Darunavir     Sulfa Antibiotics        No current outpatient medications on file.     No current facility-administered medications for this visit.        History reviewed. No pertinent past medical history.     History reviewed. No pertinent surgical history.     Social History:   Social Connections: Unknown (11/29/2021)    Received from [facility]    Social Network     Social Network: Not on file        Patient Care Team:  None, None as PCP - General    There is no problem list on file for this patient.     The exam did not reveal an ill appearing patient, a toxic appearing patient, respiratory distress, rash or dehydration.  The exam did not reveal altered mental status, listlessness or lethargy.      The patient understands the possibility of COVID-associated complications including pneumonia and will follow up immediately with any worsening symptoms or changes.    Discharge decision based on the following:  clinical impression is consistent with outpatient treatment; patient's exam is stable, patient's condition is stable.  Patient agrees to go to the ER if symptoms worsen in any way, or develops any new symptoms.   I have discussed the results, diagnosis treatment plan wit the patient. The patient also understands that early in the process of an  illness, the urgent care workup can be falsely reassuring. Routine discharge counseling and specific return precautions dicussed with the patient and the patient understand that worsening, changing or persistent symptoms should prompt an immediate return to an urgent care or emergency department.  Patient/Guardian expressed understanding and agrees with the discharge instructions. No further questions at this time of discharge.        I ADVISED PATIENT TO GO TO ER IF SYMPTOMS WORSEN , CHANGE OR FAILS TO IMPROVE.    I have discussed the diagnosis with the patient and the intended plan as seen in the above orders.  The patient has received an after-visit summary and questions were answered concerning future plans.  I have discussed medication side effects and warnings with the patient as well. The patient agrees and understands above plan.       An electronic signature was used to authenticate this note.  -- Dawna Part, APRN - NP

## 2023-05-25 ENCOUNTER — Ambulatory Visit: Admit: 2023-05-25 | Discharge: 2023-05-25 | Payer: PRIVATE HEALTH INSURANCE | Attending: Family

## 2023-05-25 DIAGNOSIS — J40 Bronchitis, not specified as acute or chronic: Secondary | ICD-10-CM

## 2023-05-25 LAB — POCT INFLUENZA A/B ANTIGEN
Inflenza A Ag: NEGATIVE
Influenza B Ag: NEGATIVE

## 2023-05-25 LAB — POCT COVID-19, ANTIGEN
Lot Number: 854928
SARS-COV-2, POC: NOT DETECTED

## 2023-05-25 MED ORDER — BENZONATATE 200 MG PO CAPS
200 | ORAL_CAPSULE | Freq: Three times a day (TID) | ORAL | 0 refills | Status: AC | PRN
Start: 2023-05-25 — End: 2023-06-01

## 2023-05-25 MED ORDER — AMOXICILLIN-POT CLAVULANATE 875-125 MG PO TABS
875-125 | ORAL_TABLET | Freq: Two times a day (BID) | ORAL | 0 refills | Status: AC
Start: 2023-05-25 — End: 2023-06-01

## 2023-05-25 MED ORDER — GUAIFENESIN-DM 100-10 MG/5ML PO SYRP
100-10 | Freq: Three times a day (TID) | ORAL | 0 refills | Status: AC | PRN
Start: 2023-05-25 — End: 2023-06-04

## 2023-05-25 NOTE — Progress Notes (Signed)
Wendy Martin (DOB:  11-20-1985) is a 37 y.o. female,Established patient, here for evaluation of the following chief complaint(s):  Pharyngitis (Sore throat, runny nose, cough, diarrhea and pain in both ears since friday)          ASSESSMENT/PLAN:    Assessment & Plan  Bronchitis            Upper respiratory tract infection, unspecified type       Orders:    POCT COVID-19, Antigen    POCT Influenza A/B Antigen         I have discussed the results, diagnosis and treatment plan with the patient. The patient also understands that early in the process of an illness, an urgent care workup can be falsely reassuring. Routine discharge counseling and specific return precautions discussed with patient and the patient understands that worsening, changing or persistent symptoms should prompt an immediate return to the urgent care or emergency department. Patient/Guardian expressed understanding and agrees with the discharge plan. No further questions at time of discharge.     SUBJECTIVE/OBJECTIVE:  37 y.o. female presents with symptoms of Pharyngitis (Sore throat, runny nose, cough, diarrhea and pain in both ears since friday)      37 year old female presents to urgent care stating she has been "feeling funny" since Friday patient states it started with a pressure in her ears and then she started sneezing and having runny nose sore throat and now coughing patient states she also had some diarrhea this week no nausea or vomiting some mild stomach pain patient states she has a copper taste in her mouth she also seems to have lost sensation of smell patient denies any body aches patient also has some short of breath secondary to the coughing patient states her manager was sick recently and she was around them but did not test positive for COVID patient denies any over-the-counter medications patient denies any fevers or chills patient is a smoker but denies any history of respiratory conditions      Pharyngitis        Review  of Systems   Constitutional: Negative.    HENT: See HPI  Eyes: Negative.    Respiratory: See HPI  Cardiovascular: Negative.    Gastrointestinal: Negative.    Endocrine: Negative.    Genitourinary:  Negative  Musculoskeletal: Negative.    Skin: Negative.    Allergic/Immunologic: Negative.    Neurological: Negative.    Hematological: Negative.    Psychiatric/Behavioral: Negative.       Physical Exam  Vitals and nursing note reviewed.   Constitutional:       General: She is in acute distress.      Appearance: She is ill-appearing and diaphoretic. She is not toxic-appearing.   HENT:      Right Ear: Hearing and external ear normal. No tenderness. Tympanic membrane is bulging.      Left Ear: Hearing and external ear normal. No tenderness.      Ears:      Comments: Significant cerumen bilateral ears     Nose:      Right Turbinates: Not enlarged, swollen or pale.      Left Turbinates: Not enlarged, swollen or pale.      Right Sinus: No maxillary sinus tenderness or frontal sinus tenderness.      Left Sinus: No maxillary sinus tenderness or frontal sinus tenderness.      Mouth/Throat:      Pharynx: Posterior oropharyngeal erythema present. No pharyngeal swelling, oropharyngeal exudate or uvula  swelling.      Tonsils: No tonsillar exudate.   Cardiovascular:      Rate and Rhythm: Normal rate and regular rhythm.      Pulses: Normal pulses.   Pulmonary:      Effort: Pulmonary effort is normal. No respiratory distress.      Breath sounds: Normal breath sounds. No stridor. No wheezing, rhonchi or rales.   Chest:      Chest wall: No tenderness.         Vitals:    05/25/23 1041   BP: 115/76   Pulse: 89   Temp: 98.4 F (36.9 C)   TempSrc: Oral   SpO2: 98%   Weight: 70.1 kg (154 lb 9.6 oz)       No results found for this visit on 05/25/23.     Allergies   Allergen Reactions    Primaquine Anaphylaxis    Bupropion     Clonazepam     Darunavir     Sulfa Antibiotics        Social History     Socioeconomic History    Marital status:  Single     Spouse name: Not on file    Number of children: Not on file    Years of education: Not on file    Highest education level: Not on file   Occupational History    Not on file   Tobacco Use    Smoking status: Never     Passive exposure: Never    Smokeless tobacco: Never   Substance and Sexual Activity    Alcohol use: Never    Drug use: Never    Sexual activity: Not on file   Other Topics Concern    Not on file   Social History Narrative    Not on file     Social Determinants of Health     Financial Resource Strain: Not on file   Food Insecurity: Not on file   Transportation Needs: Not on file   Physical Activity: Not on file   Stress: Not on file   Social Connections: Unknown (11/29/2021)    Received from [facility]    Social Network     Social Network: Not on file   Intimate Partner Violence: Unknown (10/27/2021)    Received from [facility]    HITS     Physically Hurt: Not on file     Insult or Talk Down To: Not on file     Threaten Physical Harm: Not on file     Scream or Curse: Not on file   Housing Stability: Not on file       No past medical history on file.    No past surgical history on file.    Prior to Admission medications    Not on File        Follow up if symptoms persist or if symptoms worsen.    I ADVISED PATIENT TO GO TO ER IF SYMPTOMS WORSEN , CHANGE OR FAILS TO IMPROVE.    I have discussed the diagnosis with the patient and the intended plan as seen in the above orders. The patient has received an after-visit summary and questions were answered concerning future plans. I have discussed medication side effects and warnings with the patient as well. The patient agrees and understands above plan.     An electronic signature was used to authenticate this note.    Leighton Parody, APRN - CNP

## 2023-05-25 NOTE — Patient Instructions (Signed)
Discussed with patient COVID and flu test are negative most likely viral infection however due to the fact that patient is on day 6 and has been worsening the last 2 days concerned there could be a bacterial bronchitis discussed we will give patient Augmentin if no improvement in symptoms in the next 24 to 48 hours please start Augmentin twice a day with food for 7 days discussed taking with food can minimalize GI side effects also can use cough suppressant to help with coughing discussed can use over-the-counter decongestant to help as well and can alternate acetaminophen and ibuprofen for fevers or aches but make sure if taking ibuprofen to take with food discussed with patient also need to make sure she gets plenty of fluids and rest can use tea with honey and lemon as well as throat lozenges to help discussed with patient if any worsening symptoms or no improvement and starts antibiotic and still no improvement please return to urgent care or ED depending on severity of symptoms

## 2023-07-22 ENCOUNTER — Ambulatory Visit: Admit: 2023-07-22 | Discharge: 2023-07-22 | Payer: 59

## 2023-07-22 VITALS — BP 99/60 | HR 73 | Temp 98.60000°F | Resp 18 | Wt 152.0 lb

## 2023-07-22 DIAGNOSIS — G43809 Other migraine, not intractable, without status migrainosus: Principal | ICD-10-CM

## 2023-07-22 MED ORDER — CYCLOBENZAPRINE HCL 10 MG PO TABS
10 | ORAL_TABLET | Freq: Three times a day (TID) | ORAL | 0 refills | Status: AC | PRN
Start: 2023-07-22 — End: 2023-08-01

## 2023-07-22 MED ORDER — KETOROLAC TROMETHAMINE 30 MG/ML IJ SOLN
30 | Freq: Once | INTRAMUSCULAR | Status: AC
Start: 2023-07-22 — End: 2023-07-22
  Administered 2023-07-22: 20:00:00 30 mg via INTRAMUSCULAR

## 2023-07-22 NOTE — Patient Instructions (Addendum)
 Read printed education.  Hydrate well and get plenty of good quality sleep - recommend resting in cool dark room.  Avoid triggers such as alcohol especially wine and chocolate.  Gentle neck and upper back stretching.  May try heat or cold therapy as needed

## 2023-07-22 NOTE — Progress Notes (Signed)
 Patient Name: Wendy Martin   Date of Birth:  01-02-1986   Patient Status: Established patient,   Chief Complaint: Headache (Migraine, neck pain and upper back pain. Sx 3 days. )      ____________________________________________________________________

## 2023-08-17 ENCOUNTER — Ambulatory Visit: Attending: Family
